# Patient Record
Sex: Female | Born: 1950 | Race: Black or African American | Hispanic: No | Marital: Single
Health system: Midwestern US, Community
[De-identification: ages and names within clinical notes are randomized; demographics above are authoritative.]

## PROBLEM LIST (undated history)

## (undated) DIAGNOSIS — C569 Malignant neoplasm of unspecified ovary: Secondary | ICD-10-CM

## (undated) DIAGNOSIS — F329 Major depressive disorder, single episode, unspecified: Secondary | ICD-10-CM

## (undated) DIAGNOSIS — I10 Essential (primary) hypertension: Secondary | ICD-10-CM

## (undated) DIAGNOSIS — I951 Orthostatic hypotension: Secondary | ICD-10-CM

## (undated) DIAGNOSIS — Z5189 Encounter for other specified aftercare: Secondary | ICD-10-CM

## (undated) DIAGNOSIS — E079 Disorder of thyroid, unspecified: Secondary | ICD-10-CM

## (undated) DIAGNOSIS — D689 Coagulation defect, unspecified: Secondary | ICD-10-CM

## (undated) DIAGNOSIS — D649 Anemia, unspecified: Secondary | ICD-10-CM

## (undated) DIAGNOSIS — I639 Cerebral infarction, unspecified: Secondary | ICD-10-CM

## (undated) DIAGNOSIS — T7840XA Allergy, unspecified, initial encounter: Secondary | ICD-10-CM

## (undated) DIAGNOSIS — F32A Depression, unspecified: Secondary | ICD-10-CM

## (undated) DIAGNOSIS — H269 Unspecified cataract: Secondary | ICD-10-CM

## (undated) DIAGNOSIS — R55 Syncope and collapse: Secondary | ICD-10-CM

## (undated) DIAGNOSIS — F419 Anxiety disorder, unspecified: Secondary | ICD-10-CM

## (undated) DIAGNOSIS — N183 Chronic kidney disease, stage 3 (moderate): Principal | ICD-10-CM

## (undated) DIAGNOSIS — K219 Gastro-esophageal reflux disease without esophagitis: Secondary | ICD-10-CM

## (undated) DIAGNOSIS — E785 Hyperlipidemia, unspecified: Secondary | ICD-10-CM

## (undated) DIAGNOSIS — J439 Emphysema, unspecified: Secondary | ICD-10-CM

## (undated) DIAGNOSIS — M1990 Unspecified osteoarthritis, unspecified site: Secondary | ICD-10-CM

## (undated) DIAGNOSIS — J449 Chronic obstructive pulmonary disease, unspecified: Secondary | ICD-10-CM

## (undated) HISTORY — PX: CARDIAC CATHETERIZATION: SHX172

## (undated) HISTORY — DX: Malignant neoplasm of unspecified ovary: C56.9

## (undated) HISTORY — DX: Unspecified osteoarthritis, unspecified site: M19.90

## (undated) HISTORY — DX: Chronic obstructive pulmonary disease, unspecified: J44.9

## (undated) HISTORY — DX: Essential (primary) hypertension: I10

## (undated) HISTORY — DX: Emphysema, unspecified: J43.9

## (undated) HISTORY — PX: CHOLECYSTECTOMY: SHX55

## (undated) HISTORY — DX: Depression, unspecified: F32.A

## (undated) HISTORY — DX: Disorder of thyroid, unspecified: E07.9

## (undated) HISTORY — PX: CORONARY ANGIOPLASTY WITH STENT PLACEMENT: SHX49

## (undated) HISTORY — DX: Orthostatic hypotension: I95.1

## (undated) HISTORY — DX: Allergy, unspecified, initial encounter: T78.40XA

## (undated) HISTORY — DX: Cerebral infarction, unspecified: I63.9

## (undated) HISTORY — DX: Chronic kidney disease, stage 3 (moderate): N18.3

## (undated) HISTORY — DX: Hyperlipidemia, unspecified: E78.5

## (undated) HISTORY — PX: ABDOMINAL HYSTERECTOMY: SHX81

## (undated) HISTORY — PX: OOPHORECTOMY: SHX86

## (undated) HISTORY — DX: Major depressive disorder, single episode, unspecified: F32.9

## (undated) HISTORY — DX: Syncope and collapse: R55

## (undated) HISTORY — DX: Anxiety disorder, unspecified: F41.9

## (undated) HISTORY — DX: Encounter for other specified aftercare: Z51.89

## (undated) HISTORY — DX: Coagulation defect, unspecified: D68.9

## (undated) HISTORY — DX: Anemia, unspecified: D64.9

## (undated) HISTORY — DX: Gastro-esophageal reflux disease without esophagitis: K21.9

## (undated) HISTORY — DX: Unspecified cataract: H26.9

---

## 2003-05-30 ENCOUNTER — Ambulatory Visit (HOSPITAL_COMMUNITY): Admission: RE | Admit: 2003-05-30 | Discharge: 2003-05-30 | Payer: Self-pay | Admitting: Family Medicine

## 2004-08-12 ENCOUNTER — Ambulatory Visit (HOSPITAL_COMMUNITY): Admission: RE | Admit: 2004-08-12 | Discharge: 2004-08-12 | Payer: Self-pay | Admitting: Family Medicine

## 2004-10-17 ENCOUNTER — Ambulatory Visit (HOSPITAL_COMMUNITY): Admission: RE | Admit: 2004-10-17 | Discharge: 2004-10-17 | Payer: Self-pay | Admitting: Family Medicine

## 2006-04-08 ENCOUNTER — Ambulatory Visit (HOSPITAL_COMMUNITY): Admission: RE | Admit: 2006-04-08 | Discharge: 2006-04-08 | Payer: Self-pay | Admitting: Family Medicine

## 2009-03-06 ENCOUNTER — Emergency Department (HOSPITAL_COMMUNITY): Admission: EM | Admit: 2009-03-06 | Discharge: 2009-03-06 | Payer: Self-pay | Admitting: Emergency Medicine

## 2009-04-22 ENCOUNTER — Emergency Department (HOSPITAL_COMMUNITY): Admission: EM | Admit: 2009-04-22 | Discharge: 2009-04-22 | Payer: Self-pay | Admitting: Emergency Medicine

## 2009-08-21 ENCOUNTER — Ambulatory Visit (HOSPITAL_COMMUNITY): Admission: RE | Admit: 2009-08-21 | Discharge: 2009-08-21 | Payer: Self-pay | Admitting: Family Medicine

## 2009-10-08 ENCOUNTER — Encounter (INDEPENDENT_AMBULATORY_CARE_PROVIDER_SITE_OTHER): Payer: Self-pay | Admitting: Cardiology

## 2009-10-08 ENCOUNTER — Ambulatory Visit (HOSPITAL_COMMUNITY): Admission: RE | Admit: 2009-10-08 | Discharge: 2009-10-08 | Payer: Self-pay | Admitting: Cardiology

## 2009-10-18 ENCOUNTER — Ambulatory Visit (HOSPITAL_COMMUNITY): Admission: RE | Admit: 2009-10-18 | Discharge: 2009-10-18 | Payer: Self-pay | Admitting: Cardiology

## 2009-10-24 ENCOUNTER — Inpatient Hospital Stay (HOSPITAL_COMMUNITY): Admission: RE | Admit: 2009-10-24 | Discharge: 2009-10-25 | Payer: Self-pay | Admitting: Cardiology

## 2009-12-20 ENCOUNTER — Ambulatory Visit (HOSPITAL_COMMUNITY): Admission: RE | Admit: 2009-12-20 | Discharge: 2009-12-20 | Payer: Self-pay | Admitting: Cardiology

## 2011-01-12 ENCOUNTER — Encounter: Payer: Self-pay | Admitting: Family Medicine

## 2011-03-24 LAB — CREATININE, SERUM
Creatinine, Ser: 0.79 mg/dL (ref 0.4–1.2)
GFR calc Af Amer: >60 mL/min (ref >60)
GFR calc non Af Amer: >60 mL/min (ref >60)

## 2011-03-26 LAB — URINALYSIS, ROUTINE W REFLEX MICROSCOPIC
Bilirubin Urine: NEGATIVE
Glucose, UA: NEGATIVE mg/dL
Ketones, ur: 15 mg/dL — AB
Nitrite: NEGATIVE
Protein, ur: NEGATIVE mg/dL
Specific Gravity, Urine: 1.014 (ref 1.005–1.030)
Urobilinogen, UA: 0.2 mg/dL (ref 0.0–1.0)
pH: 6 (ref 5.0–8.0)

## 2011-03-26 LAB — CARDIAC PANEL(CRET KIN+CKTOT+MB+TROPI)
CK, MB: 1.5 ng/mL (ref 0.3–4.0)
Relative Index: INVALID (ref 0.0–2.5)
Total CK: 81 U/L (ref 7–177)
Troponin I: 0.06 ng/mL (ref 0.00–0.06)

## 2011-03-26 LAB — POCT I-STAT 4, (NA,K, GLUC, HGB,HCT)
Glucose, Bld: 121 mg/dL — ABNORMAL HIGH (ref 70–99)
HCT: 43 % (ref 36.0–46.0)
Hemoglobin: 14.6 g/dL (ref 12.0–15.0)
Potassium: 3.6 mEq/L (ref 3.5–5.1)
Sodium: 140 mEq/L (ref 135–145)

## 2011-03-26 LAB — URINE MICROSCOPIC-ADD ON

## 2011-03-26 LAB — POCT I-STAT 3, ART BLOOD GAS (G3+)
Acid-base deficit: 3 mmol/L — ABNORMAL HIGH (ref 0.0–2.0)
Bicarbonate: 21.9 mEq/L (ref 20.0–24.0)
O2 Saturation: 92 %
TCO2: 23 mmol/L (ref 0–100)
pCO2 arterial: 36.5 mmHg (ref 35.0–45.0)
pH, Arterial: 7.386 (ref 7.350–7.400)
pO2, Arterial: 65 mmHg — ABNORMAL LOW (ref 80.0–100.0)

## 2011-03-26 LAB — BASIC METABOLIC PANEL
BUN: 6 mg/dL (ref 6–23)
CO2: 24 mEq/L (ref 19–32)
Calcium: 7.7 mg/dL — ABNORMAL LOW (ref 8.4–10.5)
Chloride: 107 mEq/L (ref 96–112)
Creatinine, Ser: 0.98 mg/dL (ref 0.4–1.2)
GFR calc Af Amer: >60 mL/min (ref >60)
GFR calc non Af Amer: 59 mL/min — ABNORMAL LOW (ref >60)
Glucose, Bld: 102 mg/dL — ABNORMAL HIGH (ref 70–99)
Potassium: 3.6 mEq/L (ref 3.5–5.1)
Sodium: 136 mEq/L (ref 135–145)

## 2011-03-26 LAB — CBC
HCT: 35 % — ABNORMAL LOW (ref 36.0–46.0)
Hemoglobin: 12.2 g/dL (ref 12.0–15.0)
MCHC: 34.9 g/dL (ref 30.0–36.0)
MCV: 96.8 fL (ref 78.0–100.0)
Platelets: 236 10*3/uL (ref 150–400)
RBC: 3.61 MIL/uL — ABNORMAL LOW (ref 3.87–5.11)
RDW: 12.8 % (ref 11.5–15.5)
WBC: 7.4 10*3/uL (ref 4.0–10.5)

## 2011-05-05 ENCOUNTER — Other Ambulatory Visit: Payer: Self-pay | Admitting: Medical

## 2011-07-02 ENCOUNTER — Other Ambulatory Visit: Payer: Self-pay | Admitting: Medical

## 2011-07-09 ENCOUNTER — Other Ambulatory Visit: Payer: Self-pay | Admitting: Medical

## 2011-07-09 NOTE — Telephone Encounter (Signed)
Is this ok?

## 2011-09-01 ENCOUNTER — Other Ambulatory Visit: Payer: Self-pay | Admitting: Medical

## 2012-02-14 ENCOUNTER — Other Ambulatory Visit: Payer: Self-pay | Admitting: Medical

## 2012-03-24 ENCOUNTER — Other Ambulatory Visit: Payer: Self-pay | Admitting: Medical

## 2012-03-24 NOTE — Telephone Encounter (Deleted)
Is this ok?

## 2014-09-13 ENCOUNTER — Other Ambulatory Visit (HOSPITAL_COMMUNITY): Payer: Self-pay | Admitting: Oral Surgery

## 2014-09-13 DIAGNOSIS — G8929 Other chronic pain: Secondary | ICD-10-CM

## 2014-09-13 DIAGNOSIS — M26629 Arthralgia of temporomandibular joint, unspecified side: Principal | ICD-10-CM

## 2014-09-14 ENCOUNTER — Other Ambulatory Visit (HOSPITAL_COMMUNITY): Payer: Self-pay | Admitting: Oral Surgery

## 2014-09-21 ENCOUNTER — Ambulatory Visit (HOSPITAL_COMMUNITY): Payer: Medicare Other

## 2014-09-26 ENCOUNTER — Ambulatory Visit (HOSPITAL_COMMUNITY): Admission: RE | Admit: 2014-09-26 | Payer: Medicare Other | Source: Ambulatory Visit

## 2014-10-04 ENCOUNTER — Ambulatory Visit (HOSPITAL_COMMUNITY): Admission: RE | Admit: 2014-10-04 | Payer: Medicare Other | Source: Ambulatory Visit

## 2014-10-19 ENCOUNTER — Ambulatory Visit (HOSPITAL_COMMUNITY)
Admission: RE | Admit: 2014-10-19 | Discharge: 2014-10-19 | Disposition: A | Discharge status: Home / Self Care | Payer: Medicare Other | Source: Ambulatory Visit | Attending: Oral Surgery | Admitting: Oral Surgery

## 2014-10-19 DIAGNOSIS — M26629 Arthralgia of temporomandibular joint, unspecified side: Principal | ICD-10-CM

## 2014-10-19 DIAGNOSIS — G8929 Other chronic pain: Secondary | ICD-10-CM

## 2014-10-20 ENCOUNTER — Ambulatory Visit (HOSPITAL_COMMUNITY)
Admission: RE | Admit: 2014-10-20 | Discharge: 2014-10-20 | Disposition: A | Discharge status: Home / Self Care | Payer: Medicare Other | Source: Ambulatory Visit | Attending: Oral Surgery | Admitting: Oral Surgery

## 2014-10-20 ENCOUNTER — Other Ambulatory Visit (HOSPITAL_COMMUNITY): Payer: Medicare Other

## 2014-10-20 ENCOUNTER — Encounter (HOSPITAL_COMMUNITY): Payer: Self-pay

## 2014-10-20 ENCOUNTER — Other Ambulatory Visit (HOSPITAL_COMMUNITY): Payer: Self-pay | Admitting: Oral Surgery

## 2014-10-20 DIAGNOSIS — G8929 Other chronic pain: Secondary | ICD-10-CM

## 2014-10-20 DIAGNOSIS — M26629 Arthralgia of temporomandibular joint, unspecified side: Secondary | ICD-10-CM

## 2014-10-20 DIAGNOSIS — R6884 Jaw pain: Secondary | ICD-10-CM | POA: Insufficient documentation

## 2014-12-22 HISTORY — PX: TRIGEMINAL NERVE DECOMPRESSION: SHX2579

## 2015-05-15 ENCOUNTER — Telehealth (HOSPITAL_COMMUNITY): Payer: Self-pay | Admitting: *Deleted

## 2015-06-27 ENCOUNTER — Ambulatory Visit (HOSPITAL_COMMUNITY): Payer: Self-pay | Admitting: Psychiatry

## 2015-12-28 ENCOUNTER — Telehealth (HOSPITAL_COMMUNITY): Payer: Self-pay | Admitting: *Deleted

## 2016-01-14 ENCOUNTER — Ambulatory Visit (HOSPITAL_COMMUNITY): Payer: Self-pay | Admitting: Psychiatry

## 2016-02-14 ENCOUNTER — Ambulatory Visit (HOSPITAL_COMMUNITY): Payer: Self-pay | Admitting: Psychiatry

## 2016-03-26 ENCOUNTER — Encounter (HOSPITAL_COMMUNITY): Payer: Self-pay | Admitting: Psychiatry

## 2016-03-26 ENCOUNTER — Ambulatory Visit (INDEPENDENT_AMBULATORY_CARE_PROVIDER_SITE_OTHER): Payer: Medicare Other | Admitting: Psychiatry

## 2016-03-26 VITALS — BP 153/94 | HR 75 | Ht <= 58 in | Wt 128.8 lb

## 2016-03-26 DIAGNOSIS — F4310 Post-traumatic stress disorder, unspecified: Secondary | ICD-10-CM

## 2016-03-26 MED ORDER — DULOXETINE HCL 60 MG PO CPEP: 60.0000 mg | ORAL_CAPSULE | Freq: Two times a day (BID) | ORAL | Status: DC

## 2016-03-26 MED ORDER — ALPRAZOLAM 0.5 MG PO TABS: 0.5000 mg | ORAL_TABLET | Freq: Every day | ORAL | Status: DC
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
## 2016-04-17 ENCOUNTER — Ambulatory Visit (INDEPENDENT_AMBULATORY_CARE_PROVIDER_SITE_OTHER): Payer: Medicare Other | Admitting: Psychology

## 2016-04-17 DIAGNOSIS — F4310 Post-traumatic stress disorder, unspecified: Secondary | ICD-10-CM
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
## 2016-04-24 ENCOUNTER — Encounter (HOSPITAL_COMMUNITY): Payer: Self-pay | Admitting: Psychiatry

## 2016-04-24 ENCOUNTER — Ambulatory Visit (INDEPENDENT_AMBULATORY_CARE_PROVIDER_SITE_OTHER): Payer: Medicare Other | Admitting: Psychiatry

## 2016-04-24 VITALS — BP 113/73 | HR 80 | Ht <= 58 in | Wt 131.0 lb

## 2016-04-24 DIAGNOSIS — F4310 Post-traumatic stress disorder, unspecified: Secondary | ICD-10-CM | POA: Diagnosis not present

## 2016-04-24 MED ORDER — ALPRAZOLAM 0.5 MG PO TABS: 0.5000 mg | ORAL_TABLET | Freq: Every day | ORAL | Status: DC

## 2016-04-24 MED ORDER — DULOXETINE HCL 60 MG PO CPEP: 60.0000 mg | ORAL_CAPSULE | Freq: Two times a day (BID) | ORAL | Status: DC
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
## 2016-04-29 ENCOUNTER — Encounter: Payer: Self-pay | Admitting: Obstetrics & Gynecology

## 2016-04-29 ENCOUNTER — Ambulatory Visit (INDEPENDENT_AMBULATORY_CARE_PROVIDER_SITE_OTHER): Payer: Medicare Other | Admitting: Obstetrics & Gynecology

## 2016-04-29 VITALS — BP 130/90 | HR 76 | Ht <= 58 in | Wt 134.0 lb

## 2016-04-29 DIAGNOSIS — A499 Bacterial infection, unspecified: Secondary | ICD-10-CM

## 2016-04-29 DIAGNOSIS — B9689 Other specified bacterial agents as the cause of diseases classified elsewhere: Secondary | ICD-10-CM

## 2016-04-29 DIAGNOSIS — N760 Acute vaginitis: Secondary | ICD-10-CM

## 2016-04-29 MED ORDER — METRONIDAZOLE 0.75 % VA GEL: VAGINAL | Status: DC

## 2016-04-29 MED ORDER — DOXYCYCLINE HYCLATE 100 MG PO TABS: 100.0000 mg | ORAL_TABLET | Freq: Two times a day (BID) | ORAL | Status: DC

## 2016-04-29 NOTE — Progress Notes (Signed)
Patient ID: Kelly Chambers, female   DOB: May 26, 1951, 65 y.o.   MRN: [id number]       Chief Complaint  Patient presents with  . nwe gyn visit    vaginal discharge, gray in color/ hurt when has sex    Blood pressure 130/90, pulse 76, height 4\' 8"  (1.422 m), weight 134 lb (60.782 kg).  65 y.o. No obstetric history on file. No LMP recorded. Patient is postmenopausal. The current method of family planning is .  Subjective Vaginal discharge for several  weeks Itching yes Irritation yes Odor no Similar to previous no  Previous treatment   Objective Vulva:  normal appearing vulva with no masses, tenderness or lesions, vulvar erythema  Vagina:  normal mucosa, thin grey discharge Cervix:   Uterus:   Adnexa: ovaries:,       Pertinent ROS No burning with urination, frequency or urgency No nausea, vomiting or diarrhea Nor fever chills or other constitutional symptoms   Labs or studies Wet Prep:   A sample of vaginal discharge was obtained from the posterior fornix using a cotton swab. 2 drops of saline were placed on a slide and the cotton swab was immersed in the saline. Microscopic evaluation was performed and results were as follows:  Negative  for yeast  Positive for clue cells , consistent with Bacterial vaginosis Negative for trichomonas  Abnormal- heavy WBC population   Whiff test: Positive     Impression Diagnoses this Encounter::   ICD-9-CM ICD-10-CM   1. BV (bacterial vaginosis) 616.10 N76.0    041.9 A49.9   2. Vaginitis 616.10 N76.0     Established relevant diagnosis(es):   Plan/Recommendations: Meds ordered this encounter  Medications  . doxycycline (VIBRA-TABS) 100 MG tablet    Sig: Take 1 tablet (100 mg total) by mouth 2 (two) times daily.    Dispense:  20 tablet    Refill:  0  . metroNIDAZOLE (METROGEL VAGINAL) 0.75 % vaginal gel    Sig: Use every other Night x 10 nights    Dispense:  70 g    Refill:  0    Labs or Scans Ordered: No  orders of the defined types were placed in this encounter.    Management::   Follow up Return in about 3 weeks (around 05/20/2016) for Follow up, with Dr Elonda Husky.          All questions were answered.

## 2016-05-09 ENCOUNTER — Ambulatory Visit (INDEPENDENT_AMBULATORY_CARE_PROVIDER_SITE_OTHER): Payer: Medicare Other | Admitting: Psychology

## 2016-05-09 ENCOUNTER — Encounter (HOSPITAL_COMMUNITY): Payer: Self-pay | Admitting: Psychology

## 2016-05-09 DIAGNOSIS — F4310 Post-traumatic stress disorder, unspecified: Secondary | ICD-10-CM
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
## 2016-05-20 ENCOUNTER — Ambulatory Visit: Payer: Medicare Other | Admitting: Obstetrics & Gynecology

## 2016-05-23 ENCOUNTER — Ambulatory Visit (HOSPITAL_COMMUNITY): Payer: Self-pay | Admitting: Psychology

## 2016-06-25 ENCOUNTER — Ambulatory Visit (INDEPENDENT_AMBULATORY_CARE_PROVIDER_SITE_OTHER): Payer: Medicare Other | Admitting: Psychiatry

## 2016-06-25 ENCOUNTER — Encounter (HOSPITAL_COMMUNITY): Payer: Self-pay | Admitting: Psychiatry

## 2016-06-25 VITALS — BP 180/108 | Ht <= 58 in | Wt 129.0 lb

## 2016-06-25 DIAGNOSIS — F4310 Post-traumatic stress disorder, unspecified: Secondary | ICD-10-CM

## 2016-06-25 MED ORDER — DULOXETINE HCL 60 MG PO CPEP: 60.0000 mg | ORAL_CAPSULE | Freq: Two times a day (BID) | ORAL | Status: DC

## 2016-06-25 MED ORDER — ALPRAZOLAM 0.5 MG PO TABS: 0.5000 mg | ORAL_TABLET | Freq: Three times a day (TID) | ORAL | Status: DC | PRN
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
## 2016-07-15 ENCOUNTER — Ambulatory Visit (INDEPENDENT_AMBULATORY_CARE_PROVIDER_SITE_OTHER): Payer: Medicare Other | Admitting: Psychiatry

## 2016-07-15 ENCOUNTER — Encounter (HOSPITAL_COMMUNITY): Payer: Self-pay | Admitting: Psychiatry

## 2016-07-15 VITALS — BP 151/87 | HR 72 | Ht <= 58 in | Wt 134.4 lb

## 2016-07-15 DIAGNOSIS — F4310 Post-traumatic stress disorder, unspecified: Secondary | ICD-10-CM
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
## 2016-07-24 ENCOUNTER — Ambulatory Visit (INDEPENDENT_AMBULATORY_CARE_PROVIDER_SITE_OTHER): Payer: Medicare Other | Admitting: Psychology

## 2016-08-26 ENCOUNTER — Ambulatory Visit (INDEPENDENT_AMBULATORY_CARE_PROVIDER_SITE_OTHER): Payer: Medicare Other | Admitting: Psychiatry

## 2016-08-26 ENCOUNTER — Encounter (HOSPITAL_COMMUNITY): Payer: Self-pay | Admitting: Psychiatry

## 2016-08-26 VITALS — BP 161/97 | HR 82 | Ht <= 58 in | Wt 136.6 lb

## 2016-08-26 DIAGNOSIS — F4310 Post-traumatic stress disorder, unspecified: Secondary | ICD-10-CM | POA: Diagnosis not present

## 2016-08-26 MED ORDER — ALPRAZOLAM 0.5 MG PO TABS: 0.5000 mg | ORAL_TABLET | Freq: Three times a day (TID) | ORAL | 2 refills | Status: DC | PRN

## 2016-08-26 MED ORDER — FLUOXETINE HCL 20 MG PO CAPS: 20.0000 mg | ORAL_CAPSULE | Freq: Every day | ORAL | 2 refills | Status: DC
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
## 2016-08-27 ENCOUNTER — Ambulatory Visit (HOSPITAL_COMMUNITY): Payer: Self-pay | Admitting: Psychology

## 2016-09-23 ENCOUNTER — Ambulatory Visit (HOSPITAL_COMMUNITY): Payer: Self-pay | Admitting: Psychiatry

## 2016-10-02 ENCOUNTER — Encounter (HOSPITAL_COMMUNITY): Payer: Self-pay | Admitting: Psychiatry

## 2016-10-02 ENCOUNTER — Ambulatory Visit (INDEPENDENT_AMBULATORY_CARE_PROVIDER_SITE_OTHER): Payer: Medicare Other | Admitting: Psychiatry

## 2016-10-02 VITALS — BP 143/86 | HR 64 | Ht <= 58 in

## 2016-10-02 DIAGNOSIS — Z811 Family history of alcohol abuse and dependence: Secondary | ICD-10-CM | POA: Diagnosis not present

## 2016-10-02 DIAGNOSIS — F17210 Nicotine dependence, cigarettes, uncomplicated: Secondary | ICD-10-CM | POA: Diagnosis not present

## 2016-10-02 DIAGNOSIS — F1099 Alcohol use, unspecified with unspecified alcohol-induced disorder: Secondary | ICD-10-CM | POA: Diagnosis not present

## 2016-10-02 DIAGNOSIS — F4310 Post-traumatic stress disorder, unspecified: Secondary | ICD-10-CM | POA: Diagnosis not present

## 2016-10-02 MED ORDER — ALPRAZOLAM 0.5 MG PO TABS: 0.5000 mg | ORAL_TABLET | Freq: Three times a day (TID) | ORAL | 2 refills | Status: DC | PRN

## 2016-10-02 MED ORDER — FLUOXETINE HCL 20 MG PO CAPS: 20.0000 mg | ORAL_CAPSULE | Freq: Every day | ORAL | 2 refills | Status: DC
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
## 2017-01-02 ENCOUNTER — Ambulatory Visit (INDEPENDENT_AMBULATORY_CARE_PROVIDER_SITE_OTHER): Payer: Medicare Other | Admitting: Psychiatry

## 2017-01-02 ENCOUNTER — Encounter (HOSPITAL_COMMUNITY): Payer: Self-pay | Admitting: Psychiatry

## 2017-01-02 VITALS — BP 146/92 | HR 86 | Ht <= 58 in | Wt 126.6 lb

## 2017-01-02 DIAGNOSIS — Z818 Family history of other mental and behavioral disorders: Secondary | ICD-10-CM | POA: Diagnosis not present

## 2017-01-02 DIAGNOSIS — Z7982 Long term (current) use of aspirin: Secondary | ICD-10-CM

## 2017-01-02 DIAGNOSIS — Z79899 Other long term (current) drug therapy: Secondary | ICD-10-CM

## 2017-01-02 DIAGNOSIS — Z9889 Other specified postprocedural states: Secondary | ICD-10-CM

## 2017-01-02 DIAGNOSIS — Z811 Family history of alcohol abuse and dependence: Secondary | ICD-10-CM

## 2017-01-02 DIAGNOSIS — F4310 Post-traumatic stress disorder, unspecified: Secondary | ICD-10-CM

## 2017-01-02 DIAGNOSIS — F17210 Nicotine dependence, cigarettes, uncomplicated: Secondary | ICD-10-CM

## 2017-01-02 DIAGNOSIS — Z880 Allergy status to penicillin: Secondary | ICD-10-CM

## 2017-01-02 MED ORDER — ALPRAZOLAM 0.5 MG PO TABS: 0.5000 mg | ORAL_TABLET | Freq: Three times a day (TID) | ORAL | 2 refills | Status: DC | PRN

## 2017-01-02 MED ORDER — FLUOXETINE HCL 20 MG PO CAPS: 20.0000 mg | ORAL_CAPSULE | Freq: Every day | ORAL | 2 refills | Status: DC
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
## 2017-03-26 ENCOUNTER — Ambulatory Visit (INDEPENDENT_AMBULATORY_CARE_PROVIDER_SITE_OTHER): Payer: Medicare Other | Admitting: Psychiatry

## 2017-03-26 ENCOUNTER — Encounter (HOSPITAL_COMMUNITY): Payer: Self-pay | Admitting: Psychiatry

## 2017-03-26 VITALS — BP 165/84 | HR 69 | Ht <= 58 in | Wt 139.4 lb

## 2017-03-26 DIAGNOSIS — Z79899 Other long term (current) drug therapy: Secondary | ICD-10-CM | POA: Diagnosis not present

## 2017-03-26 DIAGNOSIS — Z7982 Long term (current) use of aspirin: Secondary | ICD-10-CM

## 2017-03-26 DIAGNOSIS — Z811 Family history of alcohol abuse and dependence: Secondary | ICD-10-CM | POA: Diagnosis not present

## 2017-03-26 DIAGNOSIS — Z818 Family history of other mental and behavioral disorders: Secondary | ICD-10-CM | POA: Diagnosis not present

## 2017-03-26 DIAGNOSIS — F17210 Nicotine dependence, cigarettes, uncomplicated: Secondary | ICD-10-CM

## 2017-03-26 DIAGNOSIS — F4310 Post-traumatic stress disorder, unspecified: Secondary | ICD-10-CM | POA: Diagnosis not present

## 2017-03-26 MED ORDER — ALPRAZOLAM 0.5 MG PO TABS: 0.5000 mg | ORAL_TABLET | Freq: Three times a day (TID) | ORAL | 2 refills | Status: DC | PRN

## 2017-03-26 MED ORDER — TRAZODONE HCL 50 MG PO TABS: 50.0000 mg | ORAL_TABLET | Freq: Every day | ORAL | 2 refills | Status: DC

## 2017-03-26 MED ORDER — FLUOXETINE HCL 40 MG PO CAPS: 40.0000 mg | ORAL_CAPSULE | Freq: Every day | ORAL | 2 refills | Status: DC
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
## 2017-05-07 ENCOUNTER — Encounter (HOSPITAL_COMMUNITY): Payer: Self-pay | Admitting: Psychiatry

## 2017-05-07 ENCOUNTER — Ambulatory Visit (INDEPENDENT_AMBULATORY_CARE_PROVIDER_SITE_OTHER): Payer: Medicare Other | Admitting: Psychiatry

## 2017-05-07 VITALS — BP 133/85 | HR 77 | Ht <= 58 in | Wt 134.2 lb

## 2017-05-07 DIAGNOSIS — Z811 Family history of alcohol abuse and dependence: Secondary | ICD-10-CM

## 2017-05-07 DIAGNOSIS — Z79899 Other long term (current) drug therapy: Secondary | ICD-10-CM

## 2017-05-07 DIAGNOSIS — F17210 Nicotine dependence, cigarettes, uncomplicated: Secondary | ICD-10-CM | POA: Diagnosis not present

## 2017-05-07 DIAGNOSIS — Z818 Family history of other mental and behavioral disorders: Secondary | ICD-10-CM

## 2017-05-07 DIAGNOSIS — F4310 Post-traumatic stress disorder, unspecified: Secondary | ICD-10-CM | POA: Diagnosis not present

## 2017-05-07 MED ORDER — FLUOXETINE HCL 40 MG PO CAPS: 40.0000 mg | ORAL_CAPSULE | Freq: Every day | ORAL | 2 refills | Status: DC

## 2017-05-07 MED ORDER — ALPRAZOLAM 0.5 MG PO TABS: 0.5000 mg | ORAL_TABLET | Freq: Three times a day (TID) | ORAL | 1 refills | Status: DC | PRN

## 2017-05-07 MED ORDER — TRAZODONE HCL 50 MG PO TABS: 50.0000 mg | ORAL_TABLET | Freq: Every day | ORAL | 2 refills | Status: DC
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
## 2017-06-25 ENCOUNTER — Ambulatory Visit (INDEPENDENT_AMBULATORY_CARE_PROVIDER_SITE_OTHER): Payer: Medicare Other | Admitting: Family Medicine

## 2017-06-25 ENCOUNTER — Encounter: Payer: Self-pay | Admitting: Family Medicine

## 2017-06-25 VITALS — BP 118/70 | HR 72 | Temp 98.7°F | Resp 18 | Ht <= 58 in | Wt 136.0 lb

## 2017-06-25 DIAGNOSIS — Z955 Presence of coronary angioplasty implant and graft: Secondary | ICD-10-CM

## 2017-06-25 DIAGNOSIS — Z720 Tobacco use: Secondary | ICD-10-CM | POA: Insufficient documentation

## 2017-06-25 DIAGNOSIS — I2510 Atherosclerotic heart disease of native coronary artery without angina pectoris: Secondary | ICD-10-CM | POA: Diagnosis not present

## 2017-06-25 DIAGNOSIS — R296 Repeated falls: Secondary | ICD-10-CM | POA: Diagnosis not present

## 2017-06-25 DIAGNOSIS — R42 Dizziness and giddiness: Secondary | ICD-10-CM | POA: Diagnosis not present

## 2017-06-25 DIAGNOSIS — I10 Essential (primary) hypertension: Secondary | ICD-10-CM | POA: Insufficient documentation

## 2017-06-25 DIAGNOSIS — J439 Emphysema, unspecified: Secondary | ICD-10-CM | POA: Diagnosis not present

## 2017-06-25 DIAGNOSIS — E785 Hyperlipidemia, unspecified: Secondary | ICD-10-CM | POA: Diagnosis not present

## 2017-06-25 DIAGNOSIS — J449 Chronic obstructive pulmonary disease, unspecified: Secondary | ICD-10-CM | POA: Insufficient documentation

## 2017-06-25 MED ORDER — OMEPRAZOLE 20 MG PO CPDR: 20.0000 mg | DELAYED_RELEASE_CAPSULE | Freq: Every day | ORAL | 3 refills | Status: DC

## 2017-06-25 NOTE — Progress Notes (Addendum)
Chief Complaint  Patient presents with  . COPD   This is a new patient to establish. She has a history of tobacco abuse. She switched to E cigarettes. She has been reducing the amount of nicotine and the amount of cigarettes. She cannot tell me how many cartridges a day, but tells me she smokes less than half of which she used to. She has COPD. She is out of all of her inhalers. She states she cannot afford to get them filled right now. She is advised to continue to taper and discontinue her cigarettes. She has a history of hypertension. She is on amlodipine 5 mg a day. Her blood pressure is low at 118/70. She brings with her a log of blood pressures at home. They're often under 100 and under 70. She has a history of dizziness and falls. I'm going to cut her amlodipine in half and see if this helps. She has a history of coronary artery disease, coronary stent, and "light strokes". She is on a statin, on an aspirin daily. She denies any current symptoms. She has a history of depression and, anxiety, and PTSD. She is under the care of psychiatry. She takes fluoxetine daily, Xanax 3 times a day, trazodone at night. She states they don't work. She thinks they need to be increased. Paradoxically, she appears to be sedated and somewhat sluggish today. Patient used to have trigeminal neuralgia. She had surgery for a new nerve decompression. She tells me that after her surgery she fell and hit her head. It ever since then she's been dizzy. She thinks she has a head injury. She has not been evaluated, had a CAT scan, seen a neurologist, or been worked up by her prior PCP. She states that she falls frequently. She most recently fell last week.   Patient Active Problem List   Diagnosis Date Noted  . Tobacco abuse 06/25/2017  . COPD with emphysema ([place]) 06/25/2017  . CAD (coronary artery disease) 06/25/2017  . Hx of heart artery stent 06/25/2017  . HLD (hyperlipidemia) 06/25/2017  . Essential hypertension  06/25/2017  . Post traumatic stress disorder 03/26/2016    Outpatient Encounter Prescriptions as of 06/25/2017  Medication Sig  . albuterol (PROVENTIL HFA;VENTOLIN HFA) 108 (90 Base) MCG/ACT inhaler Inhale into the lungs.  . ALPRAZolam (XANAX) 0.5 MG tablet Take 1 tablet (0.5 mg total) by mouth 3 (three) times daily as needed for anxiety.  Marland Kitchen amLODipine (NORVASC) 5 MG tablet Take 2.5 mg by mouth daily.  Marland Kitchen aspirin 81 MG chewable tablet Chew by mouth.  Marland Kitchen atorvastatin (LIPITOR) 10 MG tablet Take by mouth.  . budesonide-formoterol (SYMBICORT) 160-4.5 MCG/ACT inhaler Inhale into the lungs.  . cholecalciferol (VITAMIN D) 1000 units tablet Take 1,000 Units by mouth daily.  Marland Kitchen FLUoxetine (PROZAC) 40 MG capsule Take 1 capsule (40 mg total) by mouth daily.  Marland Kitchen levothyroxine (SYNTHROID, LEVOTHROID) 75 MCG tablet Take by mouth.  . Multiple Vitamin (THERA) TABS Take by mouth.  . naproxen sodium (ANAPROX) 220 MG tablet Take 220 mg by mouth 2 (two) times daily with a meal.  . nitroGLYCERIN (NITROSTAT) 0.4 MG SL tablet Place under the tongue.  Marland Kitchen omeprazole (PRILOSEC) 20 MG capsule Take 1 capsule (20 mg total) by mouth daily.  Marland Kitchen tiotropium (SPIRIVA) 18 MCG inhalation capsule Place into inhaler and inhale.  . traZODone (DESYREL) 50 MG tablet Take 1 tablet (50 mg total) by mouth at bedtime.   No facility-administered encounter medications on file as of 06/25/2017.  Past Medical History:  Diagnosis Date  . Allergy    hay fever  . Anemia   . Anxiety   . Blood transfusion without reported diagnosis    c section  . Cataract    no surgery  . Clotting disorder ([place])   . COPD (chronic obstructive pulmonary disease) ([place])   . Depression   . Emphysema of lung ([place])   . GERD (gastroesophageal reflux disease)   . Hyperlipidemia   . Hypertension   . Osteoarthritis    back hips shoulders and hands  . Stroke ([place])   . Thyroid disease     Past Surgical History:  Procedure Laterality Date  . ABDOMINAL  HYSTERECTOMY     uncertain  . CARDIAC CATHETERIZATION    . CESAREAN SECTION    . CHOLECYSTECTOMY    . CORONARY ANGIOPLASTY WITH STENT PLACEMENT    . TRIGEMINAL NERVE DECOMPRESSION Right 2016    Social History   Social History  . Marital status: Married    Spouse name: Elta Guadeloupe  . Number of children: N/A  . Years of education: 67   Occupational History  . disabled     depression/PTSD   Social History Main Topics  . Smoking status: Current Every Day Smoker    Packs/day: 0.50    Types: E-cigarettes  . Smokeless tobacco: Former Systems developer  . Alcohol use Yes     Comment: 03-26-16 per pt once every 6 months  . Drug use: No     Comment: 03-26-16 per pt no  . Sexual activity: Not Currently   Other Topics Concern  . Not on file   Social History Narrative   Disabled mental illness   Previously was a Quarry manager for 30 years   Lives with Elta Guadeloupe and    sister Horris Latino and    Royston Cowper, age 67    Family History  Problem Relation Age of Onset  . Alcohol abuse Mother   . Arthritis Mother   . Mental retardation Mother   . Early death Mother 61       per patient, grief  . Cancer Mother        lung  . Alcohol abuse Father   . Cancer Father        everywhere  . Arthritis Father   . Depression Sister   . Alcohol abuse Sister   . Multiple sclerosis Sister   . Alzheimer's disease Sister   . Mental retardation Sister        DOWNS  . Cancer Maternal Grandfather        throat    Review of Systems  Constitutional: Negative for chills, fever and weight loss.  HENT: Positive for hearing loss. Negative for congestion.   Eyes: Positive for blurred vision. Negative for double vision.       States has cataracts  Respiratory: Negative for cough and shortness of breath.   Cardiovascular: Negative for chest pain, palpitations and leg swelling.  Gastrointestinal: Positive for heartburn. Negative for nausea and vomiting.       Controlled on medicine  Genitourinary: Negative.  Negative for dysuria.    Musculoskeletal: Negative.  Negative for myalgias and neck pain.  Skin: Negative for rash.  Neurological: Positive for dizziness, tingling and headaches.  Psychiatric/Behavioral: Positive for depression. Negative for substance abuse and suicidal ideas. The patient is nervous/anxious and has insomnia.     BP 118/70 (BP Location: Right Arm, Patient Position: Sitting, Cuff Size: Normal)   Pulse 72   Temp  98.7 F (37.1 C) (Temporal)   Resp 18   Ht 4\' 8"  (1.422 m)   Wt 136 lb (61.7 kg)   SpO2 96%   BMI 30.49 kg/m   Physical Exam  Constitutional: She appears well-developed and well-nourished.  Looks older than stated age. Malodorous. Difficult to understand. Inattentive  HENT:  Head: Normocephalic and atraumatic.  Mouth/Throat: Oropharynx is clear and moist.  Edentulous  Eyes: Conjunctivae are normal. Pupils are equal, round, and reactive to light.  Neck: Normal range of motion. No thyromegaly present.  Cardiovascular: Normal rate, regular rhythm and normal heart sounds.   Pulmonary/Chest: Effort normal and breath sounds normal. She has no wheezes.  Abdominal: Soft. Bowel sounds are normal. She exhibits no distension.  Musculoskeletal: Normal range of motion. She exhibits no edema.  Lymphadenopathy:    She has no cervical adenopathy.  Neurological: She is alert. Coordination normal.  Psychiatric: Her affect is blunt. Her speech is slurred. She is withdrawn.  Mild slurred speech. Appears sedated. Asks me to repeat many questions. Poor comprehension. Poor insight. She is inattentive.     1. Tobacco abuse Patient states she is actively trying to stop smoking  2. Pulmonary emphysema, unspecified emphysema type ([place]) Patient is out of her inhalers. She has no wheezing today.  3. Coronary artery disease involving native coronary artery of native heart without angina pectoris  - Vitamin B12 - COMPLETE METABOLIC PANEL WITH GFR - Hepatitis C antibody - HIV antibody - Lipid  panel - Urinalysis, Routine w reflex microscopic - VITAMIN D 25 Hydroxy (Vit-D Deficiency, Fractures)  4. Hx of heart artery stent 2010. No cardiology follow-up  5. Hyperlipidemia, unspecified hyperlipidemia type Compliant with atorvastatin  6. Essential hypertension On amlodipine 5 a day. Low blood pressure and dizziness. We'll reduce  7. Falls frequently - Ambulatory referral to Neurology  8. Dizziness, nonspecific - Ambulatory referral to Neurology Greater than 50% of this visit was spent in counseling and coordinating care.  Total face to face time:  45 min. most of this was obtaining history and reviewing records, also discussing her dizziness, falls, headaches, possible head injury   Patient Instructions  Stop the meclizine Cut the amlodipine in half  Need records Dr Wenda Overland  I have referred to neurology specialist for evaluation of dizziness and falls  Lab testing ordered  See me in one month     Kelly Everts, MD

## 2017-06-25 NOTE — Patient Instructions (Signed)
Stop the meclizine Cut the amlodipine in half  Need records Dr Wenda Overland  I have referred to neurology specialist for evaluation of dizziness and falls  Lab testing ordered  See me in one month

## 2017-06-26 ENCOUNTER — Encounter: Payer: Self-pay | Admitting: Family Medicine

## 2017-06-26 DIAGNOSIS — N183 Chronic kidney disease, stage 3 unspecified: Secondary | ICD-10-CM

## 2017-06-26 HISTORY — DX: Chronic kidney disease, stage 3 unspecified: N18.30

## 2017-06-26 LAB — COMPLETE METABOLIC PANEL WITH GFR
ALT: 9 U/L (ref 6–29)
AST: 19 U/L (ref 10–35)
Albumin: 4.1 g/dL (ref 3.6–5.1)
Alkaline Phosphatase: 68 U/L (ref 33–130)
BILIRUBIN TOTAL: 0.7 mg/dL (ref 0.2–1.2)
BUN: 15 mg/dL (ref 7–25)
CO2: 24 mmol/L (ref 20–31)
CREATININE: 1.2 mg/dL — AB (ref 0.50–0.99)
Calcium: 8.7 mg/dL (ref 8.6–10.4)
Chloride: 105 mmol/L (ref 98–110)
GFR, Est African American: 55 mL/min — ABNORMAL LOW (ref >=60)
GFR, Est Non African American: 48 mL/min — ABNORMAL LOW (ref >=60)
GLUCOSE: 92 mg/dL (ref 65–99)
Potassium: 4.1 mmol/L (ref 3.5–5.3)
SODIUM: 140 mmol/L (ref 135–146)
TOTAL PROTEIN: 6.6 g/dL (ref 6.1–8.1)

## 2017-06-26 LAB — URINALYSIS, ROUTINE W REFLEX MICROSCOPIC
BILIRUBIN URINE: NEGATIVE
GLUCOSE, UA: NEGATIVE
HGB URINE DIPSTICK: NEGATIVE
KETONES UR: NEGATIVE
LEUKOCYTES UA: NEGATIVE
Nitrite: NEGATIVE
PH: 5.5 (ref 5.0–8.0)
PROTEIN: NEGATIVE
Specific Gravity, Urine: 1.017 (ref 1.001–1.035)

## 2017-06-26 LAB — HEPATITIS C ANTIBODY: HCV Ab: NEGATIVE

## 2017-06-26 LAB — LIPID PANEL
CHOL/HDL RATIO: 2.9 ratio (ref <5.0)
CHOLESTEROL: 140 mg/dL (ref <200)
HDL: 49 mg/dL — AB (ref >50)
LDL CALC: 75 mg/dL (ref <100)
TRIGLYCERIDES: 80 mg/dL (ref <150)
VLDL: 16 mg/dL (ref <30)

## 2017-06-26 LAB — HIV ANTIBODY (ROUTINE TESTING W REFLEX): HIV 1&2 Ab, 4th Generation: NONREACTIVE

## 2017-06-26 LAB — VITAMIN D 25 HYDROXY (VIT D DEFICIENCY, FRACTURES): Vit D, 25-Hydroxy: 40 ng/mL (ref 30–100)

## 2017-06-26 LAB — VITAMIN B12: VITAMIN B 12: 768 pg/mL (ref 200–1100)

## 2017-07-15 DIAGNOSIS — S92524A Nondisplaced fracture of medial phalanx of right lesser toe(s), initial encounter for closed fracture: Secondary | ICD-10-CM | POA: Diagnosis not present

## 2017-07-15 DIAGNOSIS — I10 Essential (primary) hypertension: Secondary | ICD-10-CM | POA: Diagnosis not present

## 2017-07-15 DIAGNOSIS — S62642A Nondisplaced fracture of proximal phalanx of right middle finger, initial encounter for closed fracture: Secondary | ICD-10-CM | POA: Diagnosis not present

## 2017-07-15 DIAGNOSIS — J449 Chronic obstructive pulmonary disease, unspecified: Secondary | ICD-10-CM | POA: Diagnosis not present

## 2017-07-15 DIAGNOSIS — Z7982 Long term (current) use of aspirin: Secondary | ICD-10-CM | POA: Diagnosis not present

## 2017-07-15 DIAGNOSIS — E039 Hypothyroidism, unspecified: Secondary | ICD-10-CM | POA: Diagnosis not present

## 2017-07-15 DIAGNOSIS — Z8673 Personal history of transient ischemic attack (TIA), and cerebral infarction without residual deficits: Secondary | ICD-10-CM | POA: Diagnosis not present

## 2017-07-15 DIAGNOSIS — I2510 Atherosclerotic heart disease of native coronary artery without angina pectoris: Secondary | ICD-10-CM | POA: Diagnosis not present

## 2017-07-15 DIAGNOSIS — S92514A Nondisplaced fracture of proximal phalanx of right lesser toe(s), initial encounter for closed fracture: Secondary | ICD-10-CM | POA: Diagnosis not present

## 2017-07-15 DIAGNOSIS — W1839XA Other fall on same level, initial encounter: Secondary | ICD-10-CM | POA: Diagnosis not present

## 2017-07-15 DIAGNOSIS — Z79899 Other long term (current) drug therapy: Secondary | ICD-10-CM | POA: Diagnosis not present

## 2017-07-15 DIAGNOSIS — K219 Gastro-esophageal reflux disease without esophagitis: Secondary | ICD-10-CM | POA: Diagnosis not present

## 2017-07-27 ENCOUNTER — Ambulatory Visit: Payer: Self-pay | Admitting: Family Medicine

## 2017-07-29 ENCOUNTER — Encounter (HOSPITAL_COMMUNITY): Payer: Self-pay | Admitting: Psychiatry

## 2017-07-29 ENCOUNTER — Ambulatory Visit (INDEPENDENT_AMBULATORY_CARE_PROVIDER_SITE_OTHER): Payer: Medicare Other | Admitting: Psychiatry

## 2017-07-29 VITALS — BP 114/68 | HR 67 | Ht <= 58 in | Wt 134.0 lb

## 2017-07-29 DIAGNOSIS — F17210 Nicotine dependence, cigarettes, uncomplicated: Secondary | ICD-10-CM

## 2017-07-29 DIAGNOSIS — F4310 Post-traumatic stress disorder, unspecified: Secondary | ICD-10-CM | POA: Diagnosis not present

## 2017-07-29 DIAGNOSIS — Z811 Family history of alcohol abuse and dependence: Secondary | ICD-10-CM

## 2017-07-29 DIAGNOSIS — F419 Anxiety disorder, unspecified: Secondary | ICD-10-CM | POA: Diagnosis not present

## 2017-07-29 DIAGNOSIS — Z810 Family history of intellectual disabilities: Secondary | ICD-10-CM | POA: Diagnosis not present

## 2017-07-29 DIAGNOSIS — Z818 Family history of other mental and behavioral disorders: Secondary | ICD-10-CM

## 2017-07-29 DIAGNOSIS — G4700 Insomnia, unspecified: Secondary | ICD-10-CM

## 2017-07-29 MED ORDER — TRAZODONE HCL 50 MG PO TABS: 50.0000 mg | ORAL_TABLET | Freq: Every day | ORAL | 2 refills | Status: DC

## 2017-07-29 MED ORDER — ALPRAZOLAM 0.5 MG PO TABS: 0.5000 mg | ORAL_TABLET | Freq: Three times a day (TID) | ORAL | 1 refills | Status: DC | PRN

## 2017-07-29 MED ORDER — FLUOXETINE HCL 40 MG PO CAPS: 40.0000 mg | ORAL_CAPSULE | Freq: Every day | ORAL | 2 refills | Status: DC
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
## 2017-07-30 ENCOUNTER — Encounter: Payer: Self-pay | Admitting: Family Medicine

## 2017-07-30 ENCOUNTER — Ambulatory Visit (INDEPENDENT_AMBULATORY_CARE_PROVIDER_SITE_OTHER): Payer: Self-pay | Admitting: Family Medicine

## 2017-07-30 VITALS — BP 126/70 | HR 66 | Temp 98.3°F | Resp 18 | Ht <= 58 in | Wt 135.1 lb

## 2017-07-30 DIAGNOSIS — I10 Essential (primary) hypertension: Secondary | ICD-10-CM

## 2017-07-30 DIAGNOSIS — Z720 Tobacco use: Secondary | ICD-10-CM

## 2017-07-30 DIAGNOSIS — I2510 Atherosclerotic heart disease of native coronary artery without angina pectoris: Secondary | ICD-10-CM

## 2017-07-30 DIAGNOSIS — J439 Emphysema, unspecified: Secondary | ICD-10-CM

## 2017-07-30 NOTE — Patient Instructions (Signed)
No change in medicine  Try to quit smoking  Walk every day that you are able  Use the pain cream as needed for back  See me in six months Need blood work next time
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
## 2017-08-20 DIAGNOSIS — Z7982 Long term (current) use of aspirin: Secondary | ICD-10-CM | POA: Diagnosis not present

## 2017-08-20 DIAGNOSIS — M1990 Unspecified osteoarthritis, unspecified site: Secondary | ICD-10-CM | POA: Diagnosis not present

## 2017-08-20 DIAGNOSIS — E039 Hypothyroidism, unspecified: Secondary | ICD-10-CM | POA: Diagnosis not present

## 2017-08-20 DIAGNOSIS — M545 Low back pain: Secondary | ICD-10-CM | POA: Diagnosis not present

## 2017-08-20 DIAGNOSIS — J449 Chronic obstructive pulmonary disease, unspecified: Secondary | ICD-10-CM | POA: Diagnosis not present

## 2017-08-20 DIAGNOSIS — K219 Gastro-esophageal reflux disease without esophagitis: Secondary | ICD-10-CM | POA: Diagnosis not present

## 2017-08-20 DIAGNOSIS — Z955 Presence of coronary angioplasty implant and graft: Secondary | ICD-10-CM | POA: Diagnosis not present

## 2017-08-20 DIAGNOSIS — I10 Essential (primary) hypertension: Secondary | ICD-10-CM | POA: Diagnosis not present

## 2017-08-20 DIAGNOSIS — I2510 Atherosclerotic heart disease of native coronary artery without angina pectoris: Secondary | ICD-10-CM | POA: Diagnosis not present

## 2017-08-20 DIAGNOSIS — W010XXA Fall on same level from slipping, tripping and stumbling without subsequent striking against object, initial encounter: Secondary | ICD-10-CM | POA: Diagnosis not present

## 2017-08-20 DIAGNOSIS — Z79899 Other long term (current) drug therapy: Secondary | ICD-10-CM | POA: Diagnosis not present

## 2017-09-01 DIAGNOSIS — J449 Chronic obstructive pulmonary disease, unspecified: Secondary | ICD-10-CM | POA: Diagnosis not present

## 2017-09-01 DIAGNOSIS — E039 Hypothyroidism, unspecified: Secondary | ICD-10-CM | POA: Diagnosis not present

## 2017-09-01 DIAGNOSIS — R0602 Shortness of breath: Secondary | ICD-10-CM | POA: Diagnosis not present

## 2017-09-01 DIAGNOSIS — R5383 Other fatigue: Secondary | ICD-10-CM | POA: Diagnosis not present

## 2017-09-01 DIAGNOSIS — Z955 Presence of coronary angioplasty implant and graft: Secondary | ICD-10-CM | POA: Diagnosis not present

## 2017-09-01 DIAGNOSIS — R0603 Acute respiratory distress: Secondary | ICD-10-CM | POA: Diagnosis not present

## 2017-09-01 DIAGNOSIS — E7800 Pure hypercholesterolemia, unspecified: Secondary | ICD-10-CM | POA: Diagnosis not present

## 2017-09-01 DIAGNOSIS — I10 Essential (primary) hypertension: Secondary | ICD-10-CM | POA: Diagnosis not present

## 2017-09-01 DIAGNOSIS — K219 Gastro-esophageal reflux disease without esophagitis: Secondary | ICD-10-CM | POA: Diagnosis not present

## 2017-09-01 DIAGNOSIS — R109 Unspecified abdominal pain: Secondary | ICD-10-CM | POA: Diagnosis not present

## 2017-09-01 DIAGNOSIS — R51 Headache: Secondary | ICD-10-CM | POA: Diagnosis not present

## 2017-09-01 DIAGNOSIS — I2510 Atherosclerotic heart disease of native coronary artery without angina pectoris: Secondary | ICD-10-CM | POA: Diagnosis not present

## 2017-09-01 DIAGNOSIS — Z8673 Personal history of transient ischemic attack (TIA), and cerebral infarction without residual deficits: Secondary | ICD-10-CM | POA: Diagnosis not present

## 2017-09-01 DIAGNOSIS — R079 Chest pain, unspecified: Secondary | ICD-10-CM | POA: Diagnosis not present

## 2017-09-01 DIAGNOSIS — M545 Low back pain: Secondary | ICD-10-CM | POA: Diagnosis not present

## 2017-09-01 DIAGNOSIS — G43909 Migraine, unspecified, not intractable, without status migrainosus: Secondary | ICD-10-CM | POA: Diagnosis not present

## 2017-09-01 DIAGNOSIS — R410 Disorientation, unspecified: Secondary | ICD-10-CM | POA: Diagnosis not present

## 2017-09-01 DIAGNOSIS — Z79899 Other long term (current) drug therapy: Secondary | ICD-10-CM | POA: Diagnosis not present

## 2017-09-08 ENCOUNTER — Encounter: Payer: Self-pay | Admitting: Cardiovascular Disease

## 2017-09-09 ENCOUNTER — Other Ambulatory Visit: Payer: Self-pay

## 2017-09-09 MED ORDER — TIOTROPIUM BROMIDE MONOHYDRATE 18 MCG IN CAPS: 18.0000 ug | ORAL_CAPSULE | RESPIRATORY_TRACT | 0 refills | Status: DC | PRN

## 2017-09-10 ENCOUNTER — Telehealth: Payer: Self-pay | Admitting: Family Medicine

## 2017-09-10 NOTE — Telephone Encounter (Signed)
Patient called and left message on voice mail stating that she needed appt because she thinks she has cancer in her mouth and her tongue.  I returned call and left message on voice mail for patient to return call.

## 2017-09-11 ENCOUNTER — Encounter: Payer: Self-pay | Admitting: Family Medicine

## 2017-09-11 ENCOUNTER — Other Ambulatory Visit: Payer: Self-pay

## 2017-09-11 ENCOUNTER — Ambulatory Visit (INDEPENDENT_AMBULATORY_CARE_PROVIDER_SITE_OTHER): Payer: Self-pay | Admitting: Family Medicine

## 2017-09-11 VITALS — BP 132/72 | HR 68 | Temp 98.2°F | Resp 18 | Ht <= 58 in | Wt 125.1 lb

## 2017-09-11 DIAGNOSIS — Z720 Tobacco use: Secondary | ICD-10-CM

## 2017-09-11 DIAGNOSIS — K1379 Other lesions of oral mucosa: Secondary | ICD-10-CM

## 2017-09-11 DIAGNOSIS — H9312 Tinnitus, left ear: Secondary | ICD-10-CM

## 2017-09-11 DIAGNOSIS — R634 Abnormal weight loss: Secondary | ICD-10-CM

## 2017-09-11 DIAGNOSIS — J439 Emphysema, unspecified: Secondary | ICD-10-CM

## 2017-09-11 MED ORDER — MAGIC MOUTHWASH: 5.0000 mL | Freq: Three times a day (TID) | ORAL | 0 refills | Status: DC

## 2017-09-11 MED ORDER — BUDESONIDE-FORMOTEROL FUMARATE 160-4.5 MCG/ACT IN AERO: 1.0000 | INHALATION_SPRAY | Freq: Two times a day (BID) | RESPIRATORY_TRACT | 11 refills | Status: DC

## 2017-09-11 MED ORDER — TIOTROPIUM BROMIDE MONOHYDRATE 18 MCG IN CAPS: 18.0000 ug | ORAL_CAPSULE | RESPIRATORY_TRACT | 3 refills | Status: DC | PRN

## 2017-09-11 NOTE — Patient Instructions (Signed)
Stop smoking ENT referral placed for the ringing in your ear and the sore tongue Mouthwash ordered for the sore mouth Refill Symbicort on the computer (coupon)  See me usual schedule

## 2017-09-11 NOTE — Progress Notes (Signed)
Chief Complaint  Patient presents with  . Oral Pain   Acute visit Tongue pain for a month Worried about tongue cancer Has reduced her smoking to 4 cig a day Trying to quit Also complains of unremitting ringing in the left ear Is out of her inhalers due to cost  Patient Active Problem List   Diagnosis Date Noted  . Renal failure, chronic, stage 3 (moderate) 06/26/2017  . Tobacco abuse 06/25/2017  . COPD with emphysema ([place]) 06/25/2017  . CAD (coronary artery disease) 06/25/2017  . Hx of heart artery stent 06/25/2017  . HLD (hyperlipidemia) 06/25/2017  . Essential hypertension 06/25/2017  . Post traumatic stress disorder 03/26/2016    Outpatient Encounter Prescriptions as of 09/11/2017  Medication Sig  . albuterol (PROVENTIL HFA;VENTOLIN HFA) 108 (90 Base) MCG/ACT inhaler Inhale into the lungs.  . ALPRAZolam (XANAX) 0.5 MG tablet Take 1 tablet (0.5 mg total) by mouth 3 (three) times daily as needed for anxiety.  Marland Kitchen amLODipine (NORVASC) 5 MG tablet Take 2.5 mg by mouth daily.  Marland Kitchen aspirin 81 MG chewable tablet Chew by mouth.  Marland Kitchen atorvastatin (LIPITOR) 10 MG tablet Take by mouth.  . budesonide-formoterol (SYMBICORT) 160-4.5 MCG/ACT inhaler Inhale 1 puff into the lungs 2 (two) times daily.  . cholecalciferol (VITAMIN D) 1000 units tablet Take 1,000 Units by mouth daily.  Marland Kitchen FLUoxetine (PROZAC) 40 MG capsule Take 1 capsule (40 mg total) by mouth daily.  Marland Kitchen levothyroxine (SYNTHROID, LEVOTHROID) 75 MCG tablet Take by mouth.  . Multiple Vitamin (THERA) TABS Take by mouth.  . naproxen sodium (ANAPROX) 220 MG tablet Take 220 mg by mouth 2 (two) times daily with a meal.  . nitroGLYCERIN (NITROSTAT) 0.4 MG SL tablet Place under the tongue.  Marland Kitchen omeprazole (PRILOSEC) 20 MG capsule Take 1 capsule (20 mg total) by mouth daily.  . traZODone (DESYREL) 50 MG tablet Take 1 tablet (50 mg total) by mouth at bedtime.  . magic mouthwash SOLN Take 5 mLs by mouth 3 (three) times daily.  Marland Kitchen tiotropium  (SPIRIVA) 18 MCG inhalation capsule Place 1 capsule (18 mcg total) into inhaler and inhale as needed.   No facility-administered encounter medications on file as of 09/11/2017.     Allergies  Allergen Reactions  . Penicillins Itching    Skin itching    Review of Systems  Constitutional: Positive for fatigue and unexpected weight change.       Weight loss 10 lbs  HENT: Positive for dental problem, mouth sores and tinnitus. Negative for ear pain, postnasal drip and rhinorrhea.   Eyes: Negative for photophobia and visual disturbance.  Respiratory: Positive for shortness of breath. Negative for cough.   Cardiovascular: Negative for chest pain and palpitations.  Gastrointestinal: Negative for constipation and diarrhea.  Neurological: Negative for dizziness and headaches.  Psychiatric/Behavioral: Positive for dysphoric mood. The patient is nervous/anxious.     BP 132/72 (BP Location: Right Arm, Patient Position: Sitting, Cuff Size: Normal)   Pulse 68   Temp 98.2 F (36.8 C)   Resp 18   Ht 4\' 8"  (1.422 m)   Wt 125 lb 1.3 oz (56.7 kg)   SpO2 95%   BMI 28.04 kg/m   Physical Exam  Constitutional: She appears well-developed and well-nourished.  Looks worried, tired  HENT:  Head: Normocephalic and atraumatic.  Right Ear: External ear normal.  Left Ear: External ear normal.  Nose: Nose normal.  Mouth/Throat: Oropharynx is clear and moist.  Tongue on the right lateral side a has  a few angiomata- no lesion identified.  No ulcerations of gums/buccal mucosa.  Left TM dull  Eyes: Pupils are equal, round, and reactive to light. EOM are normal.  Neck: Normal range of motion. No thyromegaly present.  Cardiovascular: Normal rate, regular rhythm and normal heart sounds.   Pulmonary/Chest: Effort normal and breath sounds normal. She has no wheezes.  Lymphadenopathy:    She has no cervical adenopathy.  Psychiatric:  Depressed mood and affect    ASSESSMENT/PLAN:  1. Weight loss Patient  says appetite poor  2. Tobacco abuse Discussed quitting.  "can't afford patches or medicine right now"  Husband won't quit- smokes in the home  3. Sore in mouth rx magic mouthwash - Ambulatory referral to ENT  4. Tinnitus of left ear - Ambulatory referral to ENT  5. Pulmonary emphysema, unspecified emphysema type ([place]) No wheeze off of symbicort.  Advised to go on computer for discounted med   Patient Instructions  Stop smoking ENT referral placed for the ringing in your ear and the sore tongue Mouthwash ordered for the sore mouth Refill Symbicort on the computer (coupon)  See me usual schedule   Raylene Everts, MD

## 2017-09-15 ENCOUNTER — Ambulatory Visit (INDEPENDENT_AMBULATORY_CARE_PROVIDER_SITE_OTHER): Payer: Medicare Other | Admitting: Diagnostic Neuroimaging

## 2017-09-15 ENCOUNTER — Encounter: Payer: Self-pay | Admitting: Diagnostic Neuroimaging

## 2017-09-15 VITALS — BP 116/73 | HR 67 | Ht <= 58 in | Wt 125.0 lb

## 2017-09-15 DIAGNOSIS — G43109 Migraine with aura, not intractable, without status migrainosus: Secondary | ICD-10-CM | POA: Diagnosis not present

## 2017-09-15 DIAGNOSIS — G441 Vascular headache, not elsewhere classified: Secondary | ICD-10-CM | POA: Diagnosis not present

## 2017-09-15 DIAGNOSIS — R630 Anorexia: Secondary | ICD-10-CM

## 2017-09-15 DIAGNOSIS — G4700 Insomnia, unspecified: Secondary | ICD-10-CM | POA: Diagnosis not present

## 2017-09-15 NOTE — Progress Notes (Signed)
[facility]  PATIENT: Kelly Chambers DOB: June 12, 1951  REFERRING CLINICIAN: Irving Burton HISTORY FROM: patient and daughter and chart review REASON FOR VISIT: new consult    HISTORICAL  CHIEF COMPLAINT:  Chief Complaint  Patient presents with  . NP  Meda Coffee  . Dizziness/ Falls    Pt states has some lightheadedness, Had fall and hit r side head and since has headaches.  Falls.  Joint pain.   . headache after fall one month ago.    HISTORY OF PRESENT ILLNESS:    66 year old female here for evaluation of right-sided headaches.  Patient reports intermittent balance and gait difficulty over the past 1-2 years. She has hit her head several times. She did not seek medical attention for these falls. Patient is having almost 3 headaches per week, with right-sided bumping and throbbing sensation, phonophobia, nausea. She takes a few over-the-counter aspirin without relief. Patient had history of migraine headaches at age 68 years old but does not remember details about these headaches.  Patient under significant stress at home. She has poor sleep. She eats once per day and has poor appetite. She has depression and anxiety. She is seeing psychiatry and is on Prozac and Xanax. She recently started trazodone to help with sleep.  Patient also has history of right-sided trigeminal neuralgia with symptoms starting in 2013. She was previously evaluated by another neurologist Dr. Merlene Laughter, who referred patient to [facility] neurosurgery Dr. Salomon Fick, who performed microvascular decompression on the right side in 2016.  Patient also has other symptoms including diffuse joint aches and pains, lightheadedness and dizziness, memory loss, fatigue, blurred vision, ringing in ears.    REVIEW OF SYSTEMS: Full 14 system review of systems performed and negative with exception of: As per history of present illness.   ALLERGIES: Allergies  Allergen Reactions  . Penicillins Itching    Skin  itching    HOME MEDICATIONS: Outpatient Medications Prior to Visit  Medication Sig Dispense Refill  . albuterol (PROVENTIL HFA;VENTOLIN HFA) 108 (90 Base) MCG/ACT inhaler Inhale into the lungs.    . ALPRAZolam (XANAX) 0.5 MG tablet Take 1 tablet (0.5 mg total) by mouth 3 (three) times daily as needed for anxiety. 270 tablet 1  . amLODipine (NORVASC) 5 MG tablet Take 2.5 mg by mouth daily.    Marland Kitchen aspirin 81 MG chewable tablet Chew by mouth.    Marland Kitchen atorvastatin (LIPITOR) 10 MG tablet Take by mouth.    . budesonide-formoterol (SYMBICORT) 160-4.5 MCG/ACT inhaler Inhale 1 puff into the lungs 2 (two) times daily. 1 Inhaler 11  . cholecalciferol (VITAMIN D) 1000 units tablet Take 1,000 Units by mouth daily.    Marland Kitchen FLUoxetine (PROZAC) 40 MG capsule Take 1 capsule (40 mg total) by mouth daily. 90 capsule 2  . levothyroxine (SYNTHROID, LEVOTHROID) 75 MCG tablet Take by mouth.    . magic mouthwash SOLN Take 5 mLs by mouth 3 (three) times daily. 150 mL 0  . Multiple Vitamin (THERA) TABS Take by mouth.    . naproxen sodium (ANAPROX) 220 MG tablet Take 220 mg by mouth 2 (two) times daily with a meal.    . nitroGLYCERIN (NITROSTAT) 0.4 MG SL tablet Place under the tongue.    Marland Kitchen omeprazole (PRILOSEC) 20 MG capsule Take 1 capsule (20 mg total) by mouth daily. 90 capsule 3  . tiotropium (SPIRIVA) 18 MCG inhalation capsule Place 1 capsule (18 mcg total) into inhaler and inhale as needed. 90 capsule 3  . traZODone (DESYREL) 50  MG tablet Take 1 tablet (50 mg total) by mouth at bedtime. 90 tablet 2   No facility-administered medications prior to visit.     PAST MEDICAL HISTORY: Past Medical History:  Diagnosis Date  . Allergy    hay fever  . Anemia   . Anxiety   . Blood transfusion without reported diagnosis    c section  . Cataract    no surgery  . Clotting disorder ([place])   . COPD (chronic obstructive pulmonary disease) ([place])   . Depression   . Emphysema of lung ([place])   . GERD (gastroesophageal reflux  disease)   . Hyperlipidemia   . Hypertension   . Osteoarthritis    back hips shoulders and hands  . Renal failure, chronic, stage 3 (moderate) 06/26/2017  . Stroke ([place])   . Thyroid disease     PAST SURGICAL HISTORY: Past Surgical History:  Procedure Laterality Date  . ABDOMINAL HYSTERECTOMY     uncertain  . CARDIAC CATHETERIZATION    . CESAREAN SECTION    . CHOLECYSTECTOMY    . CORONARY ANGIOPLASTY WITH STENT PLACEMENT    . TRIGEMINAL NERVE DECOMPRESSION Right 2016    FAMILY HISTORY: Family History  Problem Relation Age of Onset  . Alcohol abuse Mother   . Arthritis Mother   . Mental retardation Mother   . Early death Mother 34       per patient, grief  . Cancer Mother        lung  . Alcohol abuse Father   . Cancer Father        everywhere  . Arthritis Father   . Depression Sister   . Alcohol abuse Sister   . Multiple sclerosis Sister   . Alzheimer's disease Sister   . Mental retardation Sister        DOWNS  . Cancer Maternal Grandfather        throat    SOCIAL HISTORY:  Social History   Social History  . Marital status: Married    Spouse name: Elta Guadeloupe  . Number of children: N/A  . Years of education: 95   Occupational History  . disabled     depression/PTSD   Social History Main Topics  . Smoking status: Current Every Day Smoker    Packs/day: 0.50    Types: E-cigarettes  . Smokeless tobacco: Former Systems developer  . Alcohol use Yes     Comment: 03-26-16 per pt once every 6 months  . Drug use: No     Comment: 03-26-16 per pt no  . Sexual activity: Not Currently   Other Topics Concern  . Not on file   Social History Narrative   Disabled mental illness   Previously was a Quarry manager for 30 years   Lives with Elta Guadeloupe and    sister Horris Latino and    Royston Cowper, age 35   Lives at home with husband and sister.  Children 3.  Education   12th grade, trade school.     PHYSICAL EXAM  GENERAL EXAM/CONSTITUTIONAL: Vitals:  Vitals:   09/15/17 1237  BP: 116/73  Pulse: 67   Weight: 125 lb (56.7 kg)  Height: 4\' 8"  (1.422 m)     Body mass index is 28.02 kg/m.  Visual Acuity Screening   Right eye Left eye Both eyes  Without correction:     With correction: 20/40 20/70      Patient is in no distress; well developed, nourished and groomed; neck is supple  SMELLS OF CIG  SMOKE  EDENTULOUS  CARDIOVASCULAR:  Examination of carotid arteries is normal; no carotid bruits  Regular rate and rhythm, no murmurs  Examination of peripheral vascular system by observation and palpation is normal  EYES:  Ophthalmoscopic exam of optic discs and posterior segments is normal; no papilledema or hemorrhages  MUSCULOSKELETAL:  Gait, strength, tone, movements noted in Neurologic exam below  NEUROLOGIC: MENTAL STATUS:  No flowsheet data found.  awake, alert, oriented to person, place and time  recent and remote memory intact  normal attention and concentration  language fluent, comprehension intact, naming intact,   fund of knowledge appropriate  CRANIAL NERVE:   2nd - no papilledema on fundoscopic exam  2nd, 3rd, 4th, 6th - pupils equal and reactive to light, visual fields full to confrontation, extraocular muscles intact, no nystagmus  5th - facial sensation symmetric  7th - facial strength symmetric  8th - hearing intact  9th - palate elevates symmetrically, uvula midline  11th - shoulder shrug symmetric  12th - tongue protrusion midline  MOTOR:   normal bulk and tone, full strength in the BUE, BLE  SENSORY:   normal and symmetric to light touch, temperature, vibration  COORDINATION:   finger-nose-finger, fine finger movements normal  REFLEXES:   deep tendon reflexes TRACE and symmetric  GAIT/STATION:   narrow based gait    DIAGNOSTIC DATA (LABS, IMAGING, TESTING) - I reviewed patient records, labs, notes, testing and imaging myself where available.  Lab Results  Component Value Date   WBC 7.4 10/25/2009   HGB 12.2  10/25/2009   HCT 35.0 (L) 10/25/2009   MCV 96.8 10/25/2009   PLT 236 10/25/2009      Component Value Date/Time   NA 140 06/25/2017 1502   K 4.1 06/25/2017 1502   CL 105 06/25/2017 1502   CO2 24 06/25/2017 1502   GLUCOSE 92 06/25/2017 1502   BUN 15 06/25/2017 1502   CREATININE 1.20 (H) 06/25/2017 1502   CALCIUM 8.7 06/25/2017 1502   PROT 6.6 06/25/2017 1502   ALBUMIN 4.1 06/25/2017 1502   AST 19 06/25/2017 1502   ALT 9 06/25/2017 1502   ALKPHOS 68 06/25/2017 1502   BILITOT 0.7 06/25/2017 1502   GFRNONAA 48 (L) 06/25/2017 1502   GFRAA 55 (L) 06/25/2017 1502   Lab Results  Component Value Date   CHOL 140 06/25/2017   HDL 49 (L) 06/25/2017   LDLCALC 75 06/25/2017   TRIG 80 06/25/2017   CHOLHDL 2.9 06/25/2017   No results found for: HGBA1C Lab Results  Component Value Date   VITAMINB12 768 06/25/2017   No results found for: TSH   12/20/09 MRI brain  - Moderate small vessel disease, likely secondary to hypertension. - No acute intracranial abnormality.  12/20/09 MRA head  - Unremarkable MR angiography of the intracranial circulation.  08/21/15 CT head  1.No CT evidence of an acute intracranial abnormality or mass lesion.  2.Left fetal-type PCA.     ASSESSMENT AND PLAN  66 y.o. year old female here with increasing headaches since 2017. History of right trigeminal neuralgia, s/p microvascular vascular decompression (2016).    Ddx: migraine without aura vs secondary headache  1. Migraine with aura and without status migrainosus, not intractable   2. Decreased appetite   3. Other vascular headache   4. Insomnia, unspecified type      PLAN:  - check MRI brain to rule out secondary causes of headache  - consider depakote for migraine prevention (also can stabilize mood and increase  appetite); will avoid topiramate because pt already has low appetite; will avoid propranolol due to lightheadedness; will avoid TCAs due to age and current SSRI use - To  prevent or relieve headaches, try the following:  Cool Compress. Lie down and place a cool compress on your head.   Avoid headache triggers. If certain foods or odors seem to have triggered your migraines in the past, avoid them. A headache diary might help you identify triggers.   Include physical activity in your daily routine.   Manage stress. Find healthy ways to cope with the stressors, such as delegating tasks on your to-do list.   Practice relaxation techniques. Try deep breathing, yoga, massage and visualization.   Eat regularly. Eating regularly scheduled meals and maintaining a healthy diet might help prevent headaches. Also, drink plenty of fluids.   Follow a regular sleep schedule. Sleep deprivation might contribute to headaches  Consider biofeedback. With this mind-body technique, you learn to control certain bodily functions - such as muscle tension, heart rate and blood pressure - to prevent headaches or reduce headache pain.  Orders Placed This Encounter  Procedures  . MR BRAIN W WO CONTRAST   Return in about 4 months (around 01/15/2018).    Penni Bombard, MD 08/19/9370, 6:96 PM Certified in Neurology, Neurophysiology and Neuroimaging  [facility] [address] [place], [place] [address] [phone]

## 2017-09-15 NOTE — Patient Instructions (Signed)
-   check MRI brain   - consider depakote for migraine prevention   - To prevent or relieve headaches, try the following:  Cool Compress. Lie down and place a cool compress on your head.   Avoid headache triggers. If certain foods or odors seem to have triggered your migraines in the past, avoid them. A headache diary might help you identify triggers.   Include physical activity in your daily routine.   Manage stress. Find healthy ways to cope with the stressors, such as delegating tasks on your to-do list.   Practice relaxation techniques. Try deep breathing, yoga, massage and visualization.   Eat regularly. Eating regularly scheduled meals and maintaining a healthy diet might help prevent headaches. Also, drink plenty of fluids.   Follow a regular sleep schedule. Sleep deprivation might contribute to headaches  Consider biofeedback. With this mind-body technique, you learn to control certain bodily functions - such as muscle tension, heart rate and blood pressure - to prevent headaches or reduce headache pain.

## 2017-09-25 ENCOUNTER — Telehealth: Payer: Self-pay | Admitting: Family Medicine

## 2017-09-25 NOTE — Telephone Encounter (Signed)
Patient called to reschedule/cancel brain scan

## 2017-09-29 NOTE — Progress Notes (Signed)
Cardiology Office Note   Date:  10/01/2017   ID:  Adalaide, Jaskolski November 15, 1951, MRN [id number]  PCP:  Raylene Everts, MD  Cardiologist:   Jenkins Rouge, MD   No chief complaint on file.     History of Present Illness: Kelly Chambers is a 66 y.o. female who presents for consultation regarding CAD Referred by Dr Meda Coffee. PMH includes COPD ongoing smoking. Falls with poor balance and headaches being seen by neurology. Chronic pain syndrome anxiety and depression Lots of stress and some SSCP for which she has taken nitro .   Vague history in chart of CAD with previous stent placement No records in Epic   Did find cath report from [facility] with cath Dr Tina Griffiths and note Dr Rex Kras regarding PCI/Stent RCA for single vessel disease has not had close f/u since then  She gets atypical pain 2-3 x/month takes nitro on occasion wit relief. Pain is not always exertional Never lasts more Than a few minutes    Past Medical History:  Diagnosis Date  . Allergy    hay fever  . Anemia   . Anxiety   . Blood transfusion without reported diagnosis    c section  . Cataract    no surgery  . Clotting disorder ([place])   . COPD (chronic obstructive pulmonary disease) ([place])   . Depression   . Emphysema of lung ([place])   . GERD (gastroesophageal reflux disease)   . Hyperlipidemia   . Hypertension   . Osteoarthritis    back hips shoulders and hands  . Renal failure, chronic, stage 3 (moderate) ([place]) 06/26/2017  . Stroke ([place])   . Thyroid disease     Past Surgical History:  Procedure Laterality Date  . ABDOMINAL HYSTERECTOMY     uncertain  . CARDIAC CATHETERIZATION    . CESAREAN SECTION    . CHOLECYSTECTOMY    . CORONARY ANGIOPLASTY WITH STENT PLACEMENT    . TRIGEMINAL NERVE DECOMPRESSION Right 2016     Current Outpatient Prescriptions  Medication Sig Dispense Refill  . albuterol (PROVENTIL HFA;VENTOLIN HFA) 108 (90 Base) MCG/ACT inhaler Inhale into the lungs.    . ALPRAZolam (XANAX)  0.5 MG tablet Take 1 tablet (0.5 mg total) by mouth 3 (three) times daily as needed for anxiety. 270 tablet 1  . amLODipine (NORVASC) 5 MG tablet Take 2.5 mg by mouth daily.    Marland Kitchen aspirin 81 MG chewable tablet Chew by mouth.    Marland Kitchen atorvastatin (LIPITOR) 10 MG tablet Take by mouth.    . budesonide-formoterol (SYMBICORT) 160-4.5 MCG/ACT inhaler Inhale 1 puff into the lungs 2 (two) times daily. 1 Inhaler 11  . cholecalciferol (VITAMIN D) 1000 units tablet Take 1,000 Units by mouth daily.    Marland Kitchen FLUoxetine (PROZAC) 40 MG capsule Take 1 capsule (40 mg total) by mouth daily. 90 capsule 2  . levothyroxine (SYNTHROID, LEVOTHROID) 75 MCG tablet Take by mouth.    . magic mouthwash SOLN Take 5 mLs by mouth 3 (three) times daily. 150 mL 0  . Multiple Vitamin (THERA) TABS Take by mouth.    . naproxen sodium (ANAPROX) 220 MG tablet Take 220 mg by mouth 2 (two) times daily with a meal.    . nitroGLYCERIN (NITROSTAT) 0.4 MG SL tablet Place under the tongue.    Marland Kitchen omeprazole (PRILOSEC) 20 MG capsule Take 1 capsule (20 mg total) by mouth daily. 90 capsule 3  . tiotropium (SPIRIVA) 18 MCG inhalation capsule Place 1 capsule (  18 mcg total) into inhaler and inhale as needed. 90 capsule 3  . traZODone (DESYREL) 50 MG tablet Take 1 tablet (50 mg total) by mouth at bedtime. 90 tablet 2   No current facility-administered medications for this visit.     Allergies:   Penicillins    Social History:  The patient  reports that she has been smoking E-cigarettes.  She has been smoking about 0.50 packs per day. She has quit using smokeless tobacco. She reports that she drinks alcohol. She reports that she does not use drugs.   Family History:  The patient's family history includes Alcohol abuse in her father, mother, and sister; Alzheimer's disease in her sister; Arthritis in her father and mother; Cancer in her father, maternal grandfather, and mother; Depression in her sister; Early death (age of onset: 46) in her mother; Mental  retardation in her mother and sister; Multiple sclerosis in her sister.    ROS:  Please see the history of present illness.   Otherwise, review of systems are positive for none.   All other systems are reviewed and negative.    PHYSICAL EXAM: VS:  BP 136/78 (BP Location: Right Arm)   Pulse 66   Ht 4\' 9"  (1.448 m)   Wt 132 lb (59.9 kg)   SpO2 97%   BMI 28.56 kg/m  , BMI Body mass index is 28.56 kg/m. Affect appropriate Chronically ill white female  HEENT: normal Neck supple with no adenopathy JVP normal no bruits no thyromegaly Lungs clear with no wheezing and good diaphragmatic motion Heart:  S1/S2 no murmur, no rub, gallop or click PMI normal Abdomen: benighn, BS positve, no tenderness, no AAA no bruit.  No HSM or HJR Distal pulses intact with no bruits No edema Neuro non-focal Skin warm and dry No muscular weakness    EKG:  None available and not done by tech    Recent Labs: 06/25/2017: ALT 9; BUN 15; Creat 1.20; Potassium 4.1; Sodium 140    Lipid Panel    Component Value Date/Time   CHOL 140 06/25/2017 1502   TRIG 80 06/25/2017 1502   HDL 49 (L) 06/25/2017 1502   CHOLHDL 2.9 06/25/2017 1502   VLDL 16 06/25/2017 1502   LDLCALC 75 06/25/2017 1502      Wt Readings from Last 3 Encounters:  10/01/17 132 lb (59.9 kg)  09/15/17 125 lb (56.7 kg)  09/11/17 125 lb 1.3 oz (56.7 kg)      Other studies Reviewed: Additional studies/ records that were reviewed today include: Notes [facility] 2010 notes primary and neurology October 2018.    ASSESSMENT AND PLAN:  1.  CAD: Distant stent to RCA 2010 history ambiguous f/u lexiscan myovue  2. Smoking /COPD discussed cessation she has no motivation to quit needs f/u CXR with primary  3. Neurology poor balance MRI schedule this is unlikely to be related to her heart  4. HTN Well controlled.  Continue current medications and low sodium Dash type diet.   5. Thyroid continue replacement TSH normal  6. Cholesterol continue  statin at target for vascular disease patient  Lab Results  Component Value Date   LDLCALC 75 06/25/2017      Current medicines are reviewed at length with the patient today.  The patient does not have concerns regarding medicines.  The following changes have been made:  no change  Labs/ tests ordered today include: Lexiscan myovue and echo   Orders Placed This Encounter  Procedures  . NM Myocar Multi  W/Spect W/Wall Motion / EF  . ECHOCARDIOGRAM COMPLETE     Disposition:   FU with cardiology in a year      Signed, Jenkins Rouge, MD  10/01/2017 11:27 AM    [place] [address], [place], [place]  [address] Phone: [phone]; Fax: [phone]

## 2017-09-29 NOTE — Telephone Encounter (Signed)
Left message for Janyla to call office, and number to resch. appt.

## 2017-10-01 ENCOUNTER — Encounter: Payer: Self-pay | Admitting: Cardiovascular Disease

## 2017-10-01 ENCOUNTER — Ambulatory Visit (INDEPENDENT_AMBULATORY_CARE_PROVIDER_SITE_OTHER): Payer: Medicare Other | Admitting: Cardiovascular Disease

## 2017-10-01 VITALS — BP 136/78 | HR 66 | Ht <= 58 in | Wt 132.0 lb

## 2017-10-01 DIAGNOSIS — R079 Chest pain, unspecified: Secondary | ICD-10-CM | POA: Diagnosis not present

## 2017-10-01 DIAGNOSIS — R0600 Dyspnea, unspecified: Secondary | ICD-10-CM

## 2017-10-01 NOTE — Patient Instructions (Signed)
Medication Instructions:  Your physician recommends that you continue on your current medications as directed. Please refer to the Current Medication list given to you today.   Labwork: NONE  Testing/Procedures: Your physician has requested that you have an echocardiogram. Echocardiography is a painless test that uses sound waves to create images of your heart. It provides your doctor with information about the size and shape of your heart and how well your heart's chambers and valves are working. This procedure takes approximately one hour. There are no restrictions for this procedure.  Your physician has requested that you have a lexiscan myoview. For further information please visit [email or web address]. Please follow instruction sheet, as given.    Follow-Up: Your physician wants you to follow-up in: 1 YEAR.  You will receive a reminder letter in the mail two months in advance. If you don't receive a letter, please call our office to schedule the follow-up appointment.   Any Other Special Instructions Will Be Listed Below (If Applicable).     If you need a refill on your cardiac medications before your next appointment, please call your pharmacy.

## 2017-10-02 ENCOUNTER — Ambulatory Visit
Admission: RE | Admit: 2017-10-02 | Discharge: 2017-10-02 | Disposition: A | Discharge status: Home / Self Care | Payer: Medicare Other | Source: Ambulatory Visit | Attending: Diagnostic Neuroimaging | Admitting: Diagnostic Neuroimaging

## 2017-10-02 DIAGNOSIS — R630 Anorexia: Secondary | ICD-10-CM

## 2017-10-02 DIAGNOSIS — G441 Vascular headache, not elsewhere classified: Secondary | ICD-10-CM | POA: Diagnosis not present

## 2017-10-02 DIAGNOSIS — R51 Headache: Secondary | ICD-10-CM | POA: Diagnosis not present

## 2017-10-02 MED ORDER — GADOBENATE DIMEGLUMINE 529 MG/ML IV SOLN
12.0000 mL | Freq: Once | INTRAVENOUS | Status: AC | PRN
  Administered 2017-10-02: 12 mL via INTRAVENOUS

## 2017-10-12 ENCOUNTER — Ambulatory Visit (INDEPENDENT_AMBULATORY_CARE_PROVIDER_SITE_OTHER): Payer: Medicare Other | Admitting: Otolaryngology

## 2017-10-12 DIAGNOSIS — H9313 Tinnitus, bilateral: Secondary | ICD-10-CM

## 2017-10-12 DIAGNOSIS — H903 Sensorineural hearing loss, bilateral: Secondary | ICD-10-CM

## 2017-10-12 DIAGNOSIS — F17210 Nicotine dependence, cigarettes, uncomplicated: Secondary | ICD-10-CM

## 2017-10-12 DIAGNOSIS — R070 Pain in throat: Secondary | ICD-10-CM

## 2017-10-15 ENCOUNTER — Other Ambulatory Visit (HOSPITAL_COMMUNITY): Payer: Self-pay

## 2017-10-15 ENCOUNTER — Encounter (HOSPITAL_COMMUNITY): Payer: Self-pay

## 2017-10-29 ENCOUNTER — Ambulatory Visit (INDEPENDENT_AMBULATORY_CARE_PROVIDER_SITE_OTHER): Payer: Medicare Other | Admitting: Psychiatry

## 2017-10-29 ENCOUNTER — Encounter (HOSPITAL_COMMUNITY): Payer: Self-pay | Admitting: Psychiatry

## 2017-10-29 VITALS — BP 162/92 | HR 75 | Ht <= 58 in | Wt 130.0 lb

## 2017-10-29 DIAGNOSIS — F4310 Post-traumatic stress disorder, unspecified: Secondary | ICD-10-CM | POA: Diagnosis not present

## 2017-10-29 MED ORDER — ALPRAZOLAM 0.5 MG PO TABS: 0.5000 mg | ORAL_TABLET | Freq: Three times a day (TID) | ORAL | 1 refills | Status: DC | PRN

## 2017-10-29 MED ORDER — TRAZODONE HCL 50 MG PO TABS: 50.0000 mg | ORAL_TABLET | Freq: Every day | ORAL | 2 refills | Status: DC

## 2017-10-29 MED ORDER — FLUOXETINE HCL 40 MG PO CAPS: 40.0000 mg | ORAL_CAPSULE | Freq: Every day | ORAL | 2 refills | Status: DC
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
## 2017-11-03 ENCOUNTER — Encounter (HOSPITAL_COMMUNITY)
Admission: RE | Admit: 2017-11-03 | Discharge: 2017-11-03 | Disposition: A | Discharge status: Home / Self Care | Payer: Medicare Other | Source: Ambulatory Visit | Attending: Cardiovascular Disease | Admitting: Cardiovascular Disease

## 2017-11-03 ENCOUNTER — Encounter (HOSPITAL_COMMUNITY): Payer: Self-pay

## 2017-11-03 ENCOUNTER — Ambulatory Visit (HOSPITAL_COMMUNITY)
Admission: RE | Admit: 2017-11-03 | Discharge: 2017-11-03 | Disposition: A | Discharge status: Home / Self Care | Payer: Medicare Other | Source: Ambulatory Visit | Attending: Cardiovascular Disease | Admitting: Cardiovascular Disease

## 2017-11-03 DIAGNOSIS — R079 Chest pain, unspecified: Secondary | ICD-10-CM | POA: Diagnosis not present

## 2017-11-03 DIAGNOSIS — E785 Hyperlipidemia, unspecified: Secondary | ICD-10-CM | POA: Diagnosis not present

## 2017-11-03 DIAGNOSIS — I10 Essential (primary) hypertension: Secondary | ICD-10-CM | POA: Insufficient documentation

## 2017-11-03 DIAGNOSIS — F4310 Post-traumatic stress disorder, unspecified: Secondary | ICD-10-CM | POA: Diagnosis not present

## 2017-11-03 DIAGNOSIS — R0600 Dyspnea, unspecified: Secondary | ICD-10-CM | POA: Insufficient documentation

## 2017-11-03 DIAGNOSIS — Z720 Tobacco use: Secondary | ICD-10-CM | POA: Diagnosis not present

## 2017-11-03 DIAGNOSIS — I2510 Atherosclerotic heart disease of native coronary artery without angina pectoris: Secondary | ICD-10-CM | POA: Diagnosis not present

## 2017-11-03 LAB — NM MYOCAR MULTI W/SPECT W/WALL MOTION / EF
CHL CUP RESTING HR STRESS: 57 {beats}/min
CSEPPHR: 93 {beats}/min
LV sys vol: 10 mL
LVDIAVOL: 51 mL (ref 46–106)
NUC STRESS TID: 0.91
RATE: 0.37
SDS: 1
SRS: 1
SSS: 2

## 2017-11-03 MED ORDER — TECHNETIUM TC 99M TETROFOSMIN IV KIT
30.0000 | PACK | Freq: Once | INTRAVENOUS | Status: AC | PRN
  Administered 2017-11-03: 24.4 via INTRAVENOUS

## 2017-11-03 MED ORDER — TECHNETIUM TC 99M TETROFOSMIN IV KIT
10.0000 | PACK | Freq: Once | INTRAVENOUS | Status: AC | PRN
  Administered 2017-11-03: 7.5 via INTRAVENOUS

## 2017-11-03 MED ORDER — SODIUM CHLORIDE 0.9% FLUSH
INTRAVENOUS | Status: AC
  Administered 2017-11-03: 10 mL via INTRAVENOUS
  Filled 2017-11-03: qty 10

## 2017-11-03 MED ORDER — REGADENOSON 0.4 MG/5ML IV SOLN
INTRAVENOUS | Status: AC
  Administered 2017-11-03: 0.4 mg via INTRAVENOUS
  Filled 2017-11-03: qty 5

## 2017-11-03 NOTE — Progress Notes (Signed)
*  PRELIMINARY RESULTS* Echocardiogram 2D Echocardiogram has been performed.  [name] 11/03/2017, 12:10 PM

## 2017-11-18 ENCOUNTER — Other Ambulatory Visit: Payer: Self-pay

## 2017-11-18 ENCOUNTER — Ambulatory Visit (INDEPENDENT_AMBULATORY_CARE_PROVIDER_SITE_OTHER): Payer: Medicare Other | Admitting: Family Medicine

## 2017-11-18 ENCOUNTER — Encounter: Payer: Self-pay | Admitting: Family Medicine

## 2017-11-18 VITALS — BP 136/82 | HR 68 | Temp 98.8°F | Resp 18 | Ht <= 58 in | Wt 133.0 lb

## 2017-11-18 DIAGNOSIS — J441 Chronic obstructive pulmonary disease with (acute) exacerbation: Secondary | ICD-10-CM | POA: Diagnosis not present

## 2017-11-18 MED ORDER — AZITHROMYCIN 250 MG PO TABS: ORAL_TABLET | ORAL | 0 refills | Status: DC

## 2017-11-18 MED ORDER — BENZONATATE 100 MG PO CAPS: 100.0000 mg | ORAL_CAPSULE | Freq: Two times a day (BID) | ORAL | 0 refills | Status: DC | PRN

## 2017-11-18 MED ORDER — PREDNISONE 20 MG PO TABS: 20.0000 mg | ORAL_TABLET | Freq: Two times a day (BID) | ORAL | 0 refills | Status: DC

## 2017-11-18 NOTE — Progress Notes (Signed)
Chief Complaint  Patient presents with  . URI    x 1 week   Patient has COPD.  She is compliant with her inhalers.  She continues to smoke cigarettes.  She is here for an upper respiratory infection.  She has had progressive coughing, wheezing, shortness of breath over about a 1 week period of time.  She is coughing up yellow and green sputum.  She had a low-grade fever.  No nausea or vomiting.  No runny stuffy nose or cold symptoms.  No known exposure to illness or pneumonia.  She has up-to-date with pneumonia and flu shots.  Patient Active Problem List   Diagnosis Date Noted  . Renal failure, chronic, stage 3 (moderate) ([place]) 06/26/2017  . Tobacco abuse 06/25/2017  . COPD with emphysema ([place]) 06/25/2017  . CAD (coronary artery disease) 06/25/2017  . Hx of heart artery stent 06/25/2017  . HLD (hyperlipidemia) 06/25/2017  . Essential hypertension 06/25/2017  . Post traumatic stress disorder 03/26/2016    Outpatient Encounter Medications as of 11/18/2017  Medication Sig  . albuterol (PROVENTIL HFA;VENTOLIN HFA) 108 (90 Base) MCG/ACT inhaler Inhale into the lungs.  . ALPRAZolam (XANAX) 0.5 MG tablet Take 1 tablet (0.5 mg total) 3 (three) times daily as needed by mouth for anxiety.  Marland Kitchen amLODipine (NORVASC) 5 MG tablet Take 2.5 mg by mouth daily.  Marland Kitchen aspirin 81 MG chewable tablet Chew by mouth.  Marland Kitchen atorvastatin (LIPITOR) 10 MG tablet Take by mouth.  . budesonide-formoterol (SYMBICORT) 160-4.5 MCG/ACT inhaler Inhale 1 puff into the lungs 2 (two) times daily.  . cholecalciferol (VITAMIN D) 1000 units tablet Take 1,000 Units by mouth daily.  Marland Kitchen FLUoxetine (PROZAC) 40 MG capsule Take 1 capsule (40 mg total) daily by mouth.  . levothyroxine (SYNTHROID, LEVOTHROID) 75 MCG tablet Take by mouth.  . magic mouthwash SOLN Take 5 mLs by mouth 3 (three) times daily.  . Multiple Vitamin (THERA) TABS Take by mouth.  . naproxen sodium (ANAPROX) 220 MG tablet Take 220 mg by mouth 2 (two) times daily  with a meal.  . nitroGLYCERIN (NITROSTAT) 0.4 MG SL tablet Place under the tongue.  Marland Kitchen omeprazole (PRILOSEC) 20 MG capsule Take 1 capsule (20 mg total) by mouth daily.  Marland Kitchen tiotropium (SPIRIVA) 18 MCG inhalation capsule Place 1 capsule (18 mcg total) into inhaler and inhale as needed.  . traZODone (DESYREL) 50 MG tablet Take 1 tablet (50 mg total) at bedtime by mouth.  Marland Kitchen azithromycin (ZITHROMAX) 250 MG tablet tad  . benzonatate (TESSALON) 100 MG capsule Take 1 capsule (100 mg total) by mouth 2 (two) times daily as needed for cough.  . predniSONE (DELTASONE) 20 MG tablet Take 1 tablet (20 mg total) by mouth 2 (two) times daily with a meal.   No facility-administered encounter medications on file as of 11/18/2017.     Allergies  Allergen Reactions  . Penicillins Itching    Skin itching    Review of Systems  Respiratory: Positive for cough, shortness of breath and stridor.   All other systems reviewed and are negative. See HPI    BP 136/82 (BP Location: Left Arm, Patient Position: Sitting, Cuff Size: Normal)   Pulse 68   Temp 98.8 F (37.1 C) (Temporal)   Resp 18   Ht 4\' 9"  (1.448 m)   Wt 133 lb 0.6 oz (60.3 kg)   SpO2 95%   BMI 28.79 kg/m   Physical Exam  Constitutional: She appears well-developed and well-nourished. No distress.  Moderately  ill, appears fatigued  HENT:  Head: Normocephalic.  Right Ear: External ear normal.  Left Ear: External ear normal.  Tonsils are red  Eyes: Conjunctivae are normal. Pupils are equal, round, and reactive to light.  Neck: Normal range of motion.  Cardiovascular: Normal rate, regular rhythm and normal heart sounds.  Pulmonary/Chest: Effort normal. She has wheezes. She has no rales.  Abdominal: Soft. Bowel sounds are normal.  Musculoskeletal: Normal range of motion. She exhibits no edema.  Lymphadenopathy:    She has cervical adenopathy.  Psychiatric: She has a normal mood and affect. Her behavior is normal.    ASSESSMENT/PLAN:  1.  COPD with acute exacerbation ([place]) Discussed that this is likely caused by a virus, but because of her COPD I am going to cover her with an antibiotic.  She also needs prednisone.  I am giving her something for cough.  She is to try to quit smoking.  Push fluids.  Call me if not better by next week   Patient Instructions  Need to drink plenty of water Continue inhalers Take the azithromycin as directed This is an antibiotic Take the tessalon for cough Take the prednisone 2 X  a day  Call into better in a few days  See me in the next few weeks for discussion about the chest pain and the dog issue   Raylene Everts, MD

## 2017-11-18 NOTE — Patient Instructions (Signed)
Need to drink plenty of water Continue inhalers Take the azithromycin as directed This is an antibiotic Take the tessalon for cough Take the prednisone 2 X  a day  Call into better in a few days  See me in the next few weeks for discussion about the chest pain and the dog issue

## 2017-11-25 ENCOUNTER — Encounter: Payer: Self-pay | Admitting: Family Medicine

## 2017-12-16 ENCOUNTER — Ambulatory Visit: Payer: Self-pay | Admitting: Family Medicine

## 2018-01-14 ENCOUNTER — Ambulatory Visit (INDEPENDENT_AMBULATORY_CARE_PROVIDER_SITE_OTHER): Payer: Self-pay | Admitting: Otolaryngology

## 2018-01-16 IMAGING — NM NM MYOCAR MULTI W/SPECT W/WALL MOTION & EF
2 series · 12 of 12 positions shown · non-contrast
Comparison: none

[Series 1: rest · 6.51mm/px · 6 of 64 frames shown]
[frame 6/64]
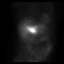
[frame 16/64]
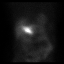
[frame 27/64]
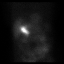
[frame 38/64]
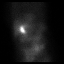
[frame 48/64]
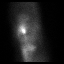
[frame 59/64]
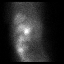

[Series 2: stress gated · 6.51mm/px · 6 of 64 frames shown]
[frame 6/64]
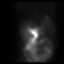
[frame 16/64]
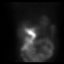
[frame 27/64]
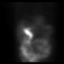
[frame 38/64]
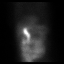
[frame 48/64]
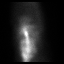
[frame 59/64]
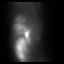

[12 of 12 positions shown; findings below may reference images not displayed]

Canned report from images found in remote index.

Refer to host system for actual result text.

## 2018-01-20 ENCOUNTER — Ambulatory Visit: Payer: Medicare Other | Admitting: Diagnostic Neuroimaging

## 2018-01-20 ENCOUNTER — Telehealth: Payer: Self-pay | Admitting: *Deleted

## 2018-01-20 NOTE — Telephone Encounter (Signed)
Pt no showed for appt today.  

## 2018-01-22 ENCOUNTER — Other Ambulatory Visit: Payer: Self-pay | Admitting: Family Medicine

## 2018-01-22 MED ORDER — AZITHROMYCIN 250 MG PO TABS: ORAL_TABLET | ORAL | 0 refills | Status: DC

## 2018-01-25 ENCOUNTER — Encounter: Payer: Self-pay | Admitting: Diagnostic Neuroimaging

## 2018-01-28 ENCOUNTER — Ambulatory Visit (HOSPITAL_COMMUNITY): Payer: Self-pay | Admitting: Psychiatry

## 2018-02-01 ENCOUNTER — Ambulatory Visit: Payer: Self-pay | Admitting: Family Medicine

## 2018-03-01 ENCOUNTER — Encounter: Payer: Self-pay | Admitting: Family Medicine

## 2018-03-25 ENCOUNTER — Encounter (HOSPITAL_COMMUNITY): Payer: Self-pay | Admitting: Psychiatry

## 2018-03-25 ENCOUNTER — Ambulatory Visit (INDEPENDENT_AMBULATORY_CARE_PROVIDER_SITE_OTHER): Payer: Self-pay | Admitting: Psychiatry

## 2018-03-25 VITALS — BP 150/86 | HR 84 | Ht <= 58 in | Wt 120.0 lb

## 2018-03-25 DIAGNOSIS — Z811 Family history of alcohol abuse and dependence: Secondary | ICD-10-CM

## 2018-03-25 DIAGNOSIS — Z62811 Personal history of psychological abuse in childhood: Secondary | ICD-10-CM

## 2018-03-25 DIAGNOSIS — F419 Anxiety disorder, unspecified: Secondary | ICD-10-CM

## 2018-03-25 DIAGNOSIS — G4700 Insomnia, unspecified: Secondary | ICD-10-CM

## 2018-03-25 DIAGNOSIS — Z736 Limitation of activities due to disability: Secondary | ICD-10-CM

## 2018-03-25 DIAGNOSIS — Z818 Family history of other mental and behavioral disorders: Secondary | ICD-10-CM

## 2018-03-25 DIAGNOSIS — Z810 Family history of intellectual disabilities: Secondary | ICD-10-CM

## 2018-03-25 DIAGNOSIS — R450 Nervousness: Secondary | ICD-10-CM

## 2018-03-25 DIAGNOSIS — F17210 Nicotine dependence, cigarettes, uncomplicated: Secondary | ICD-10-CM

## 2018-03-25 DIAGNOSIS — Z6229 Other upbringing away from parents: Secondary | ICD-10-CM

## 2018-03-25 DIAGNOSIS — F4310 Post-traumatic stress disorder, unspecified: Secondary | ICD-10-CM

## 2018-03-25 DIAGNOSIS — Z62810 Personal history of physical and sexual abuse in childhood: Secondary | ICD-10-CM

## 2018-03-25 MED ORDER — ALPRAZOLAM 0.5 MG PO TABS: 0.5000 mg | ORAL_TABLET | Freq: Three times a day (TID) | ORAL | 2 refills | Status: DC | PRN

## 2018-03-25 MED ORDER — TRAZODONE HCL 50 MG PO TABS: 50.0000 mg | ORAL_TABLET | Freq: Every day | ORAL | 2 refills | Status: DC

## 2018-03-25 MED ORDER — FLUOXETINE HCL 20 MG PO CAPS: 20.0000 mg | ORAL_CAPSULE | Freq: Every day | ORAL | 2 refills | Status: DC
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
## 2018-05-25 ENCOUNTER — Ambulatory Visit (HOSPITAL_COMMUNITY): Payer: Self-pay | Admitting: Psychiatry

## 2018-06-29 DIAGNOSIS — N183 Chronic kidney disease, stage 3 (moderate): Secondary | ICD-10-CM | POA: Diagnosis not present

## 2018-06-29 DIAGNOSIS — I10 Essential (primary) hypertension: Secondary | ICD-10-CM | POA: Diagnosis not present

## 2018-06-29 DIAGNOSIS — J449 Chronic obstructive pulmonary disease, unspecified: Secondary | ICD-10-CM | POA: Diagnosis not present

## 2018-06-29 DIAGNOSIS — E785 Hyperlipidemia, unspecified: Secondary | ICD-10-CM | POA: Diagnosis not present

## 2018-06-29 DIAGNOSIS — F4310 Post-traumatic stress disorder, unspecified: Secondary | ICD-10-CM | POA: Diagnosis not present

## 2018-06-29 DIAGNOSIS — Z130 Encounter for screening for diseases of the blood and blood-forming organs and certain disorders involving the immune mechanism: Secondary | ICD-10-CM | POA: Diagnosis not present

## 2018-06-29 DIAGNOSIS — Z1159 Encounter for screening for other viral diseases: Secondary | ICD-10-CM | POA: Diagnosis not present

## 2018-06-29 DIAGNOSIS — Z23 Encounter for immunization: Secondary | ICD-10-CM | POA: Diagnosis not present

## 2018-06-29 DIAGNOSIS — E079 Disorder of thyroid, unspecified: Secondary | ICD-10-CM | POA: Diagnosis not present

## 2018-06-29 DIAGNOSIS — F419 Anxiety disorder, unspecified: Secondary | ICD-10-CM | POA: Diagnosis not present

## 2018-06-29 DIAGNOSIS — F329 Major depressive disorder, single episode, unspecified: Secondary | ICD-10-CM | POA: Diagnosis not present

## 2018-06-29 DIAGNOSIS — G43019 Migraine without aura, intractable, without status migrainosus: Secondary | ICD-10-CM | POA: Diagnosis not present

## 2018-06-30 DIAGNOSIS — G8929 Other chronic pain: Secondary | ICD-10-CM | POA: Diagnosis not present

## 2018-06-30 DIAGNOSIS — M5136 Other intervertebral disc degeneration, lumbar region: Secondary | ICD-10-CM | POA: Diagnosis not present

## 2018-06-30 DIAGNOSIS — M533 Sacrococcygeal disorders, not elsewhere classified: Secondary | ICD-10-CM | POA: Diagnosis not present

## 2018-07-22 DIAGNOSIS — M810 Age-related osteoporosis without current pathological fracture: Secondary | ICD-10-CM | POA: Diagnosis not present

## 2018-07-22 DIAGNOSIS — M818 Other osteoporosis without current pathological fracture: Secondary | ICD-10-CM | POA: Diagnosis not present

## 2018-07-22 DIAGNOSIS — Z780 Asymptomatic menopausal state: Secondary | ICD-10-CM | POA: Diagnosis not present

## 2018-08-02 DIAGNOSIS — Z7982 Long term (current) use of aspirin: Secondary | ICD-10-CM | POA: Diagnosis not present

## 2018-08-02 DIAGNOSIS — R2231 Localized swelling, mass and lump, right upper limb: Secondary | ICD-10-CM | POA: Diagnosis not present

## 2018-08-02 DIAGNOSIS — I2510 Atherosclerotic heart disease of native coronary artery without angina pectoris: Secondary | ICD-10-CM | POA: Diagnosis not present

## 2018-08-02 DIAGNOSIS — F319 Bipolar disorder, unspecified: Secondary | ICD-10-CM | POA: Diagnosis not present

## 2018-08-02 DIAGNOSIS — K219 Gastro-esophageal reflux disease without esophagitis: Secondary | ICD-10-CM | POA: Diagnosis not present

## 2018-08-02 DIAGNOSIS — J449 Chronic obstructive pulmonary disease, unspecified: Secondary | ICD-10-CM | POA: Diagnosis not present

## 2018-08-02 DIAGNOSIS — L509 Urticaria, unspecified: Secondary | ICD-10-CM | POA: Diagnosis not present

## 2018-08-02 DIAGNOSIS — Z79899 Other long term (current) drug therapy: Secondary | ICD-10-CM | POA: Diagnosis not present

## 2018-08-02 DIAGNOSIS — I10 Essential (primary) hypertension: Secondary | ICD-10-CM | POA: Diagnosis not present

## 2018-08-04 DIAGNOSIS — N6311 Unspecified lump in the right breast, upper outer quadrant: Secondary | ICD-10-CM | POA: Diagnosis not present

## 2018-08-04 DIAGNOSIS — N6321 Unspecified lump in the left breast, upper outer quadrant: Secondary | ICD-10-CM | POA: Diagnosis not present

## 2018-08-04 DIAGNOSIS — N644 Mastodynia: Secondary | ICD-10-CM | POA: Diagnosis not present

## 2018-08-04 DIAGNOSIS — N6489 Other specified disorders of breast: Secondary | ICD-10-CM | POA: Diagnosis not present

## 2018-08-04 DIAGNOSIS — R928 Other abnormal and inconclusive findings on diagnostic imaging of breast: Secondary | ICD-10-CM | POA: Diagnosis not present

## 2018-08-17 ENCOUNTER — Encounter

## 2018-08-20 ENCOUNTER — Ambulatory Visit: Payer: MEDICARE | Primary: Internal Medicine

## 2018-08-20 NOTE — Progress Notes (Signed)
No show nuclear stress test 08-20-2018.

## 2018-08-20 NOTE — Progress Notes (Signed)
No show nuclear stress test 08-20-2018.

## 2018-08-21 ENCOUNTER — Inpatient Hospital Stay: Payer: MEDICARE | Attending: Cardiovascular Disease | Primary: Internal Medicine

## 2018-08-27 ENCOUNTER — Encounter

## 2018-08-27 ENCOUNTER — Ambulatory Visit (HOSPITAL_COMMUNITY): Payer: Self-pay | Admitting: Psychiatry

## 2018-08-27 NOTE — Progress Notes (Signed)
No show nuclear stress test 08-20-2018.

## 2018-08-27 NOTE — Progress Notes (Signed)
No show nuclear stress test 08-20-2018.

## 2018-09-01 ENCOUNTER — Ambulatory Visit (INDEPENDENT_AMBULATORY_CARE_PROVIDER_SITE_OTHER): Payer: Medicare HMO | Admitting: Psychiatry

## 2018-09-01 ENCOUNTER — Encounter (HOSPITAL_COMMUNITY): Payer: Self-pay | Admitting: Psychiatry

## 2018-09-01 VITALS — BP 130/78 | HR 78 | Ht <= 58 in | Wt 126.0 lb

## 2018-09-01 DIAGNOSIS — F17290 Nicotine dependence, other tobacco product, uncomplicated: Secondary | ICD-10-CM | POA: Diagnosis not present

## 2018-09-01 DIAGNOSIS — F419 Anxiety disorder, unspecified: Secondary | ICD-10-CM

## 2018-09-01 DIAGNOSIS — Z811 Family history of alcohol abuse and dependence: Secondary | ICD-10-CM | POA: Diagnosis not present

## 2018-09-01 DIAGNOSIS — Z62811 Personal history of psychological abuse in childhood: Secondary | ICD-10-CM | POA: Diagnosis not present

## 2018-09-01 DIAGNOSIS — F4310 Post-traumatic stress disorder, unspecified: Secondary | ICD-10-CM

## 2018-09-01 DIAGNOSIS — Z818 Family history of other mental and behavioral disorders: Secondary | ICD-10-CM | POA: Diagnosis not present

## 2018-09-01 DIAGNOSIS — Z62810 Personal history of physical and sexual abuse in childhood: Secondary | ICD-10-CM | POA: Diagnosis not present

## 2018-09-01 DIAGNOSIS — Z810 Family history of intellectual disabilities: Secondary | ICD-10-CM | POA: Diagnosis not present

## 2018-09-01 MED ORDER — TRAZODONE HCL 50 MG PO TABS: 50.0000 mg | ORAL_TABLET | Freq: Every day | ORAL | 2 refills | Status: DC

## 2018-09-01 MED ORDER — FLUOXETINE HCL 20 MG PO CAPS: 20.0000 mg | ORAL_CAPSULE | Freq: Every day | ORAL | 2 refills | Status: DC

## 2018-09-01 MED ORDER — ALPRAZOLAM 0.5 MG PO TABS: 0.5000 mg | ORAL_TABLET | Freq: Three times a day (TID) | ORAL | 2 refills | Status: DC | PRN
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
## 2018-09-02 DIAGNOSIS — R252 Cramp and spasm: Secondary | ICD-10-CM | POA: Diagnosis not present

## 2018-09-02 DIAGNOSIS — R1012 Left upper quadrant pain: Secondary | ICD-10-CM | POA: Diagnosis not present

## 2018-09-02 DIAGNOSIS — R1902 Left upper quadrant abdominal swelling, mass and lump: Secondary | ICD-10-CM | POA: Diagnosis not present

## 2018-09-03 ENCOUNTER — Encounter: Primary: Internal Medicine

## 2018-09-03 ENCOUNTER — Inpatient Hospital Stay: Admit: 2018-09-03 | Payer: MEDICARE | Attending: Cardiovascular Disease | Primary: Internal Medicine

## 2018-09-03 DIAGNOSIS — I10 Essential (primary) hypertension: Secondary | ICD-10-CM

## 2018-09-03 LAB — NM MYOCARDIAL SPECT REST EXERCISE OR RX: Left Ventricular Ejection Fraction: 70

## 2018-09-03 LAB — TREADMILL STRESS TEST, W MD SUPERVISION AND REPORT
Max Diastolic BP: 80 mmHg
Max Heart Rate: 141 {beats}/min
Max Systolic BP: 200 mmHg
Peak EX METS: 1 METS
Reason for Termination: 83 {beats}/min

## 2018-09-03 LAB — STRESS TEST CARDIAC
Max. Diastolic BP: 80 mmHg
Max. Heart rate: 141 {beats}/min
Max. Systolic BP: 200 mmHg
Peak Ex METs: 1 METS
Reason for Termination: 83 {beats}/min

## 2018-09-03 MED ORDER — TECHNETIUM TC 99M TETROFOSMIN IV KIT
Freq: Once | Status: AC
Start: 2018-09-03 — End: 2018-09-03
  Administered 2018-09-03: 18:00:00 via INTRAVENOUS

## 2018-09-03 MED ORDER — REGADENOSON 0.4 MG/5 ML IV SYRINGE
0.4 mg/5 mL | Freq: Once | INTRAVENOUS | Status: AC
Start: 2018-09-03 — End: 2018-09-03
  Administered 2018-09-03: 18:00:00 via INTRAVENOUS

## 2018-09-03 MED ORDER — TECHNETIUM TC 99M TETROFOSMIN IV KIT
Freq: Once | Status: AC
Start: 2018-09-03 — End: 2018-09-03
  Administered 2018-09-03: 16:00:00 via INTRAVENOUS

## 2018-09-03 MED FILL — LEXISCAN 0.4 MG/5 ML INTRAVENOUS SYRINGE: 0.4 mg/5 mL | INTRAVENOUS | Qty: 5

## 2018-09-08 DIAGNOSIS — R1012 Left upper quadrant pain: Secondary | ICD-10-CM | POA: Diagnosis not present

## 2018-09-08 DIAGNOSIS — R1902 Left upper quadrant abdominal swelling, mass and lump: Secondary | ICD-10-CM | POA: Diagnosis not present

## 2018-09-22 ENCOUNTER — Encounter: Primary: Internal Medicine

## 2018-09-25 DIAGNOSIS — J449 Chronic obstructive pulmonary disease, unspecified: Secondary | ICD-10-CM | POA: Diagnosis not present

## 2018-09-25 DIAGNOSIS — S3992XA Unspecified injury of lower back, initial encounter: Secondary | ICD-10-CM | POA: Diagnosis not present

## 2018-09-25 DIAGNOSIS — W010XXA Fall on same level from slipping, tripping and stumbling without subsequent striking against object, initial encounter: Secondary | ICD-10-CM | POA: Diagnosis not present

## 2018-09-25 DIAGNOSIS — K219 Gastro-esophageal reflux disease without esophagitis: Secondary | ICD-10-CM | POA: Diagnosis not present

## 2018-09-25 DIAGNOSIS — E039 Hypothyroidism, unspecified: Secondary | ICD-10-CM | POA: Diagnosis not present

## 2018-09-25 DIAGNOSIS — I2510 Atherosclerotic heart disease of native coronary artery without angina pectoris: Secondary | ICD-10-CM | POA: Diagnosis not present

## 2018-09-25 DIAGNOSIS — S39012A Strain of muscle, fascia and tendon of lower back, initial encounter: Secondary | ICD-10-CM | POA: Diagnosis not present

## 2018-09-25 DIAGNOSIS — S0990XA Unspecified injury of head, initial encounter: Secondary | ICD-10-CM | POA: Diagnosis not present

## 2018-09-25 DIAGNOSIS — R51 Headache: Secondary | ICD-10-CM | POA: Diagnosis not present

## 2018-09-25 DIAGNOSIS — M545 Low back pain: Secondary | ICD-10-CM | POA: Diagnosis not present

## 2018-09-25 DIAGNOSIS — Z79899 Other long term (current) drug therapy: Secondary | ICD-10-CM | POA: Diagnosis not present

## 2018-09-25 DIAGNOSIS — Z880 Allergy status to penicillin: Secondary | ICD-10-CM | POA: Diagnosis not present

## 2018-09-25 DIAGNOSIS — F17200 Nicotine dependence, unspecified, uncomplicated: Secondary | ICD-10-CM | POA: Diagnosis not present

## 2018-09-25 DIAGNOSIS — I10 Essential (primary) hypertension: Secondary | ICD-10-CM | POA: Diagnosis not present

## 2018-09-28 ENCOUNTER — Encounter: Payer: Self-pay | Admitting: Physician Assistant

## 2018-09-28 ENCOUNTER — Other Ambulatory Visit (HOSPITAL_COMMUNITY)
Admission: RE | Admit: 2018-09-28 | Discharge: 2018-09-28 | Disposition: A | Discharge status: Home / Self Care | Payer: Medicare HMO | Source: Ambulatory Visit | Attending: Physician Assistant | Admitting: Physician Assistant

## 2018-09-28 ENCOUNTER — Ambulatory Visit: Payer: Medicare HMO | Admitting: Physician Assistant

## 2018-09-28 VITALS — BP 126/68 | HR 74 | Ht <= 58 in | Wt 126.8 lb

## 2018-09-28 DIAGNOSIS — I2510 Atherosclerotic heart disease of native coronary artery without angina pectoris: Secondary | ICD-10-CM

## 2018-09-28 DIAGNOSIS — Z79899 Other long term (current) drug therapy: Secondary | ICD-10-CM | POA: Diagnosis not present

## 2018-09-28 DIAGNOSIS — E785 Hyperlipidemia, unspecified: Secondary | ICD-10-CM | POA: Diagnosis not present

## 2018-09-28 DIAGNOSIS — I10 Essential (primary) hypertension: Secondary | ICD-10-CM

## 2018-09-28 LAB — LIPID PANEL
CHOL/HDL RATIO: 3.2 ratio
Cholesterol: 124 mg/dL (ref 0–200)
HDL: 39 mg/dL — AB (ref >40)
LDL Cholesterol: 72 mg/dL (ref 0–99)
TRIGLYCERIDES: 65 mg/dL (ref <150)
VLDL: 13 mg/dL (ref 0–40)

## 2018-09-28 LAB — COMPREHENSIVE METABOLIC PANEL
ALBUMIN: 4.3 g/dL (ref 3.5–5.0)
ALT: 15 U/L (ref 0–44)
ANION GAP: 9 (ref 5–15)
AST: 23 U/L (ref 15–41)
Alkaline Phosphatase: 66 U/L (ref 38–126)
BILIRUBIN TOTAL: 0.8 mg/dL (ref 0.3–1.2)
BUN: 12 mg/dL (ref 8–23)
CO2: 26 mmol/L (ref 22–32)
Calcium: 9 mg/dL (ref 8.9–10.3)
Chloride: 105 mmol/L (ref 98–111)
Creatinine, Ser: 0.96 mg/dL (ref 0.44–1.00)
GFR calc non Af Amer: >60 mL/min (ref >60)
GLUCOSE: 111 mg/dL — AB (ref 70–99)
POTASSIUM: 3.3 mmol/L — AB (ref 3.5–5.1)
SODIUM: 140 mmol/L (ref 135–145)
TOTAL PROTEIN: 7.5 g/dL (ref 6.5–8.1)

## 2018-09-28 NOTE — Progress Notes (Signed)
Cardiology Office Note   Date:  09/28/2018   ID:  Sierra, Spargo [birth year], MRN [id number]  PCP:  Raylene Everts, MD Cardiologist:  Jenkins Rouge, MD  Rosaria Ferries, PA-C   No chief complaint on file.   History of Present Illness: Kelly Chambers is a 67 y.o. female with a history of COPD, tob use, falls w/ balance probs, depression, stent RCA 2010, nl EF and MV 2018  10/01/2017 office visit, Lexi MV and echo ordered, both ok 09/02/2018, PCP visit, pt w/ LUQ abdominal pain, [place] was ok. 09/21/2018 phone notes from PCP re:CP>>hospital recommended 09/21/2018 Pt states went to [facility], ez ok, no cause for the pain found.  Kelly Chambers presents for cardiology follow up.  She is worried that she has mouth/throat cancer>>has PCP appt soon.   Activity level is poor, she feels that is from depression.  She does not have appt w/ Mental Health practitioner, admits she needs one.   She climbs a few stairs into her trailer and has to walk up a hill when she checks the mail. She has to stop several times coming up the hill from [place]. She also has back problems.   When she is less depressed, she is more active. Then, she gets less chest pain and her dyspnea on exertion improves.  She is busy now, she has 6 dogs and some kittens. She feels loved by her animals.   She gets occasional chest pain. Once in a while. Sometimes she has to take a nitro, one usually does it.  It is not consistently exertional.  It is not severe.  She was thinking of harming herself a few weeks ago, her dogs helped her.   A [employer] is opening up in [place], she may join. Her daughter may join as well.  Her attitude towards this is positive.  She has balance problems, has not been evaluated for them.  She is concerned about her balance.  She has back problems as well.   She denies lower extremity edema, orthopnea, or PND.  No recent labs by her PCP.   Past Medical History:  Diagnosis Date    . Allergy    hay fever  . Anemia   . Anxiety   . Blood transfusion without reported diagnosis    c section  . Cataract    no surgery  . Clotting disorder ([place])   . COPD (chronic obstructive pulmonary disease) ([place])   . Depression   . Emphysema of lung ([place])   . GERD (gastroesophageal reflux disease)   . Hyperlipidemia   . Hypertension   . Osteoarthritis    back hips shoulders and hands  . Renal failure, chronic, stage 3 (moderate) ([place]) 06/26/2017  . Stroke ([place])   . Thyroid disease     Past Surgical History:  Procedure Laterality Date  . ABDOMINAL HYSTERECTOMY     uncertain  . CARDIAC CATHETERIZATION    . CESAREAN SECTION    . CHOLECYSTECTOMY    . CORONARY ANGIOPLASTY WITH STENT PLACEMENT    . TRIGEMINAL NERVE DECOMPRESSION Right 2016    Current Outpatient Medications  Medication Sig Dispense Refill  . albuterol (PROVENTIL HFA;VENTOLIN HFA) 108 (90 Base) MCG/ACT inhaler Inhale into the lungs.    . ALPRAZolam (XANAX) 0.5 MG tablet Take 1 tablet (0.5 mg total) by mouth 3 (three) times daily as needed for anxiety. 90 tablet 2  . amLODipine (NORVASC) 5 MG tablet Take 2.5 mg by  mouth daily.    Marland Kitchen aspirin 81 MG chewable tablet Chew by mouth.    Marland Kitchen atorvastatin (LIPITOR) 10 MG tablet Take by mouth.    . benzonatate (TESSALON) 100 MG capsule Take 1 capsule (100 mg total) by mouth 2 (two) times daily as needed for cough. 20 capsule 0  . cholecalciferol (VITAMIN D) 1000 units tablet Take 1,000 Units by mouth daily.    Marland Kitchen FLUoxetine (PROZAC) 20 MG capsule Take 1 capsule (20 mg total) by mouth daily. 30 capsule 2  . levothyroxine (SYNTHROID, LEVOTHROID) 75 MCG tablet Take by mouth.    . magic mouthwash SOLN Take 5 mLs by mouth 3 (three) times daily. 150 mL 0  . Multiple Vitamin (THERA) TABS Take by mouth.    . naproxen sodium (ANAPROX) 220 MG tablet Take 220 mg by mouth 2 (two) times daily with a meal.    . nitroGLYCERIN (NITROSTAT) 0.4 MG SL tablet Place under the tongue.    Marland Kitchen  omeprazole (PRILOSEC) 20 MG capsule Take 1 capsule (20 mg total) by mouth daily. 90 capsule 3  . predniSONE (DELTASONE) 20 MG tablet Take 1 tablet (20 mg total) by mouth 2 (two) times daily with a meal. 10 tablet 0  . tiotropium (SPIRIVA) 18 MCG inhalation capsule Place 1 capsule (18 mcg total) into inhaler and inhale as needed. 90 capsule 3  . traZODone (DESYREL) 50 MG tablet Take 1 tablet (50 mg total) by mouth at bedtime. 30 tablet 2   No current facility-administered medications for this visit.     Allergies:   Penicillins    Social History:  The patient  reports that she has been smoking e-cigarettes. She has been smoking about 1.00 pack per day. She has quit using smokeless tobacco. She reports that she drinks alcohol. She reports that she does not use drugs.   Family History:  The patient's family history includes Alcohol abuse in her father, mother, and sister; Alzheimer's disease in her sister; Arthritis in her father and mother; Cancer in her father, maternal grandfather, and mother; Depression in her sister; Early death (age of onset: 22) in her mother; Mental retardation in her mother and sister; Multiple sclerosis in her sister.    ROS:  Please see the history of present illness. All other systems are reviewed and negative.    PHYSICAL EXAM: VS:  BP 126/68   Pulse 74   Ht 4\' 10"  (1.473 m)   Wt 126 lb 12.8 oz (57.5 kg)   SpO2 99%   BMI 26.50 kg/m  , BMI Body mass index is 26.5 kg/m. GEN: Well nourished, well developed, female in no acute distress HEENT: normal for age  Neck: no JVD, no carotid bruit, no masses Cardiac: RRR; soft murmur, no rubs, or gallops Respiratory:  clear to auscultation bilaterally, normal work of breathing GI: soft, nontender, nondistended, + BS MS: no deformity or atrophy; no edema; distal pulses are 2+ in all 4 extremities  Skin: warm and dry, no rash Neuro:  Strength and sensation are intact Psych: euthymic mood, full affect   EKG:  EKG is  ordered today. The ekg ordered today demonstrates SR, HR 69, normal intervals, no acute ischemic changes.   Recent Labs: No results found for requested labs within last 8760 hours.    Lipid Panel    Component Value Date/Time   CHOL 140 06/25/2017 1502   TRIG 80 06/25/2017 1502   HDL 49 (L) 06/25/2017 1502   CHOLHDL 2.9 06/25/2017 1502  VLDL 16 06/25/2017 1502   LDLCALC 75 06/25/2017 1502     Wt Readings from Last 3 Encounters:  09/28/18 126 lb 12.8 oz (57.5 kg)  11/18/17 133 lb 0.6 oz (60.3 kg)  10/01/17 132 lb (59.9 kg)     Other studies Reviewed: Additional studies/ records that were reviewed today include: Office notes, hospital records and testing.  ASSESSMENT AND PLAN:  1.  Hypertension: She is compliant with her medications and her blood pressure is well controlled.  No changes.  2.  CAD: She is not having consistent exertional symptoms.  I am not convinced that her chest pain is true angina.  She is on aspirin, statin and has sublingual nitroglycerin.  She has not been on a beta-blocker.  She may have a history of reactive airway disease, so will avoid.  3.  Dyslipidemia, goal LDL less than 70: She has not had a lipid profile in a year.  Go ahead and check this today.  Check a complete metabolic profile as well.  4.  Medication management: Check a complete metabolic profile and lipid profile   Current medicines are reviewed at length with the patient today.  The patient does not have concerns regarding medicines.  The following changes have been made:  no change  Labs/ tests ordered today include:   Orders Placed This Encounter  Procedures  . EKG 12-Lead     Disposition:   FU with Jenkins Rouge, MD in 1 year  Signed, Rosaria Ferries, PA-C  09/28/2018 3:32 PM    [place] Phone: [phone]; Fax: [phone]  This note was written with the assistance of speech recognition software.  Please excuse any transcriptional  errors.

## 2018-09-28 NOTE — Patient Instructions (Signed)
Medication Instructions:  Your physician recommends that you continue on your current medications as directed. Please refer to the Current Medication list given to you today.  If you need a refill on your cardiac medications before your next appointment, please call your pharmacy.   Lab work: Your physician recommends that you return for lab work in: Today  If you have labs (blood work) drawn today and your tests are completely normal, you will receive your results only by: Marland Kitchen MyChart Message (if you have MyChart) OR . A paper copy in the mail If you have any lab test that is abnormal or we need to change your treatment, we will call you to review the results.  Testing/Procedures: Your physician discussed the importance of regular exercise and recommended that you start or continue a regular exercise program for good health. Walk daily for 10 minutes a day. You may increase your walking time by 1-2 minutes daily until 30 minutes daily is reached.   Follow-Up: At [facility], you and your health needs are our priority.  As part of our continuing mission to provide you with exceptional heart care, we have created designated Provider Care Teams.  These Care Teams include your primary Cardiologist (physician) and Advanced Practice Providers (APPs -  Physician Assistants and Nurse Practitioners) who all work together to provide you with the care you need, when you need it. You will need a follow up appointment in 1 years.  Please call our office 2 months in advance to schedule this appointment.  You may see Jenkins Rouge, MD or one of the following Advanced Practice Providers on your designated Care Team:   Bernerd Pho, PA-C [facility]) . Ermalinda Barrios, PA-C ([place])  Any Other Special Instructions Will Be Listed Below (If Applicable). Please have your PCP evaluate you for cancer and on your balance.

## 2018-09-30 ENCOUNTER — Telehealth: Payer: Self-pay | Admitting: *Deleted

## 2018-09-30 NOTE — Telephone Encounter (Signed)
Called patient with test results. No answer. Left message to call back.  

## 2018-09-30 NOTE — Telephone Encounter (Signed)
-----   Message from Lonn Georgia, PA-C sent at 09/29/2018  1:11 PM EDT ----- Please let her know that her cholesterol is good, her good cholesterol is a little bit low, activity may help bring that up a little bit. Her bad cholesterol is essentially at goal at 72. Her potassium is decreased at 3.3, unusual for her.  Has she been sick, any GI bugs? She could eat some potassium rich foods such as bananas, oranges or cantaloupe, that will help bring it up. She should follow-up with her PCP for this. Thanks

## 2018-10-01 ENCOUNTER — Encounter (HOSPITAL_COMMUNITY): Payer: Self-pay | Admitting: Psychiatry

## 2018-10-01 ENCOUNTER — Ambulatory Visit (INDEPENDENT_AMBULATORY_CARE_PROVIDER_SITE_OTHER): Payer: Medicare HMO | Admitting: Psychiatry

## 2018-10-01 VITALS — BP 123/73 | HR 69 | Ht <= 58 in | Wt 128.0 lb

## 2018-10-01 DIAGNOSIS — F4310 Post-traumatic stress disorder, unspecified: Secondary | ICD-10-CM | POA: Diagnosis not present

## 2018-10-01 DIAGNOSIS — F419 Anxiety disorder, unspecified: Secondary | ICD-10-CM | POA: Diagnosis not present

## 2018-10-01 MED ORDER — FLUOXETINE HCL 20 MG PO CAPS: 20.0000 mg | ORAL_CAPSULE | Freq: Every day | ORAL | 2 refills | Status: DC

## 2018-10-01 MED ORDER — ALPRAZOLAM 0.5 MG PO TABS: 0.5000 mg | ORAL_TABLET | Freq: Three times a day (TID) | ORAL | 2 refills | Status: DC | PRN

## 2018-10-01 MED ORDER — TRAZODONE HCL 50 MG PO TABS: 50.0000 mg | ORAL_TABLET | Freq: Every day | ORAL | 2 refills | Status: DC
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
## 2018-10-05 ENCOUNTER — Encounter: Payer: Self-pay | Admitting: *Deleted

## 2018-10-14 DIAGNOSIS — M549 Dorsalgia, unspecified: Secondary | ICD-10-CM | POA: Diagnosis not present

## 2018-10-14 DIAGNOSIS — R2689 Other abnormalities of gait and mobility: Secondary | ICD-10-CM | POA: Diagnosis not present

## 2018-10-14 DIAGNOSIS — I10 Essential (primary) hypertension: Secondary | ICD-10-CM | POA: Diagnosis not present

## 2018-10-14 DIAGNOSIS — J439 Emphysema, unspecified: Secondary | ICD-10-CM | POA: Diagnosis not present

## 2018-10-20 DIAGNOSIS — M549 Dorsalgia, unspecified: Secondary | ICD-10-CM | POA: Diagnosis not present

## 2018-10-20 DIAGNOSIS — M545 Low back pain: Secondary | ICD-10-CM | POA: Diagnosis not present

## 2018-10-20 DIAGNOSIS — R2689 Other abnormalities of gait and mobility: Secondary | ICD-10-CM | POA: Diagnosis not present

## 2018-10-27 DIAGNOSIS — M545 Low back pain: Secondary | ICD-10-CM | POA: Diagnosis not present

## 2018-10-27 DIAGNOSIS — R2689 Other abnormalities of gait and mobility: Secondary | ICD-10-CM | POA: Diagnosis not present

## 2018-11-04 DIAGNOSIS — E079 Disorder of thyroid, unspecified: Secondary | ICD-10-CM | POA: Diagnosis not present

## 2018-11-04 DIAGNOSIS — J439 Emphysema, unspecified: Secondary | ICD-10-CM | POA: Diagnosis not present

## 2018-11-04 DIAGNOSIS — I700 Atherosclerosis of aorta: Secondary | ICD-10-CM | POA: Diagnosis not present

## 2018-11-04 DIAGNOSIS — R0781 Pleurodynia: Secondary | ICD-10-CM | POA: Diagnosis not present

## 2018-11-04 DIAGNOSIS — Z9181 History of falling: Secondary | ICD-10-CM | POA: Diagnosis not present

## 2018-11-04 DIAGNOSIS — R296 Repeated falls: Secondary | ICD-10-CM | POA: Diagnosis not present

## 2018-11-12 DIAGNOSIS — J439 Emphysema, unspecified: Secondary | ICD-10-CM | POA: Diagnosis not present

## 2019-01-03 ENCOUNTER — Ambulatory Visit (HOSPITAL_COMMUNITY): Payer: Medicare HMO | Admitting: Psychiatry

## 2019-01-03 ENCOUNTER — Encounter (HOSPITAL_COMMUNITY): Payer: Self-pay | Admitting: Psychiatry

## 2019-01-03 VITALS — BP 134/84 | HR 68 | Ht <= 58 in | Wt 131.2 lb

## 2019-01-03 DIAGNOSIS — F4310 Post-traumatic stress disorder, unspecified: Secondary | ICD-10-CM | POA: Diagnosis not present

## 2019-01-03 MED ORDER — FLUOXETINE HCL 40 MG PO CAPS: 40.0000 mg | ORAL_CAPSULE | Freq: Every day | ORAL | 2 refills | Status: DC

## 2019-01-03 MED ORDER — ALPRAZOLAM 0.5 MG PO TABS: 0.5000 mg | ORAL_TABLET | Freq: Three times a day (TID) | ORAL | 2 refills | Status: DC | PRN

## 2019-01-03 MED ORDER — TRAZODONE HCL 100 MG PO TABS: 100.0000 mg | ORAL_TABLET | Freq: Every day | ORAL | 2 refills | Status: DC
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
## 2019-03-04 ENCOUNTER — Ambulatory Visit (INDEPENDENT_AMBULATORY_CARE_PROVIDER_SITE_OTHER): Payer: Medicare HMO | Admitting: Psychiatry

## 2019-03-04 ENCOUNTER — Other Ambulatory Visit: Payer: Self-pay

## 2019-03-04 ENCOUNTER — Encounter (HOSPITAL_COMMUNITY): Payer: Self-pay | Admitting: Psychiatry

## 2019-03-04 VITALS — BP 136/86 | HR 71 | Ht 60.0 in | Wt 133.0 lb

## 2019-03-04 DIAGNOSIS — F4310 Post-traumatic stress disorder, unspecified: Secondary | ICD-10-CM | POA: Diagnosis not present

## 2019-03-04 DIAGNOSIS — Z638 Other specified problems related to primary support group: Secondary | ICD-10-CM

## 2019-03-04 DIAGNOSIS — Z91410 Personal history of adult physical and sexual abuse: Secondary | ICD-10-CM | POA: Diagnosis not present

## 2019-03-04 DIAGNOSIS — Z62810 Personal history of physical and sexual abuse in childhood: Secondary | ICD-10-CM | POA: Diagnosis not present

## 2019-03-04 DIAGNOSIS — Z79899 Other long term (current) drug therapy: Secondary | ICD-10-CM

## 2019-03-04 MED ORDER — FLUOXETINE HCL 40 MG PO CAPS: 40.0000 mg | ORAL_CAPSULE | Freq: Every day | ORAL | 2 refills | Status: DC

## 2019-03-04 MED ORDER — ALPRAZOLAM 0.5 MG PO TABS: 0.5000 mg | ORAL_TABLET | Freq: Three times a day (TID) | ORAL | 2 refills | Status: DC | PRN

## 2019-03-04 MED ORDER — TRAZODONE HCL 100 MG PO TABS: 100.0000 mg | ORAL_TABLET | Freq: Every day | ORAL | 2 refills | Status: DC

## 2019-03-04 MED ORDER — FLUOXETINE HCL 20 MG PO CAPS: 20.0000 mg | ORAL_CAPSULE | Freq: Every day | ORAL | 2 refills | Status: DC
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
## 2019-08-13 ENCOUNTER — Other Ambulatory Visit (HOSPITAL_COMMUNITY): Payer: Self-pay | Admitting: Psychiatry

## 2019-08-15 NOTE — Telephone Encounter (Signed)
Per provider: Call patient for f/u, not seen since March   * Mail Box is full & can not accept any new messages @ this time

## 2019-08-15 NOTE — Telephone Encounter (Signed)
Call patient for f/u, not seen since March

## 2020-05-31 ENCOUNTER — Other Ambulatory Visit (HOSPITAL_COMMUNITY): Payer: Self-pay | Admitting: Psychiatry

## 2020-06-04 NOTE — Telephone Encounter (Signed)
Call pt for appt 

## 2020-06-04 NOTE — Telephone Encounter (Signed)
LMOM

## 2020-06-25 ENCOUNTER — Other Ambulatory Visit (HOSPITAL_COMMUNITY): Payer: Self-pay | Admitting: Psychiatry

## 2020-06-26 NOTE — Telephone Encounter (Signed)
Needs appt, not seen for months

## 2020-06-27 NOTE — Telephone Encounter (Signed)
UTR and UTLMOM. Per message this number was not able to accept message at this time.

## 2020-07-09 NOTE — Telephone Encounter (Signed)
LMOM

## 2020-07-25 ENCOUNTER — Other Ambulatory Visit (HOSPITAL_COMMUNITY): Payer: Self-pay | Admitting: Psychiatry

## 2020-07-25 NOTE — Telephone Encounter (Signed)
Patient voicemail box is full

## 2020-07-25 NOTE — Telephone Encounter (Signed)
Call for appt

## 2020-08-01 ENCOUNTER — Telehealth (HOSPITAL_COMMUNITY): Payer: Self-pay | Admitting: Psychiatry

## 2020-08-01 NOTE — Telephone Encounter (Signed)
Called the only number on file, unable to leave voicemail due to mailbox being full

## 2020-08-21 ENCOUNTER — Other Ambulatory Visit (HOSPITAL_COMMUNITY): Payer: Self-pay | Admitting: Psychiatry

## 2020-08-21 NOTE — Telephone Encounter (Signed)
Called patient to sch f/u appt for patient and was not able to sch appt and recording stated voicemail box is full.

## 2020-08-21 NOTE — Telephone Encounter (Signed)
Call for appt

## 2020-09-23 ENCOUNTER — Other Ambulatory Visit (HOSPITAL_COMMUNITY): Payer: Self-pay | Admitting: Psychiatry

## 2020-09-24 NOTE — Telephone Encounter (Signed)
Call for appt

## 2020-09-27 ENCOUNTER — Telehealth (HOSPITAL_COMMUNITY): Payer: Medicare HMO | Admitting: Psychiatry

## 2020-09-27 ENCOUNTER — Other Ambulatory Visit: Payer: Self-pay

## 2020-10-10 ENCOUNTER — Telehealth (HOSPITAL_COMMUNITY): Payer: Medicare HMO | Admitting: Psychiatry

## 2020-10-10 ENCOUNTER — Other Ambulatory Visit: Payer: Self-pay

## 2020-10-24 ENCOUNTER — Other Ambulatory Visit (HOSPITAL_COMMUNITY): Payer: Self-pay | Admitting: Psychiatry

## 2020-11-08 ENCOUNTER — Telehealth (HOSPITAL_COMMUNITY): Payer: Self-pay | Admitting: Psychiatry

## 2020-11-08 NOTE — Telephone Encounter (Signed)
Called patient to schedule f/u appt, left vm

## 2020-12-05 ENCOUNTER — Other Ambulatory Visit: Payer: Self-pay

## 2020-12-05 ENCOUNTER — Encounter (HOSPITAL_COMMUNITY): Payer: Medicare HMO | Admitting: Psychiatry

## 2020-12-06 ENCOUNTER — Ambulatory Visit: Payer: Medicare HMO | Admitting: Orthopaedic Surgery

## 2020-12-06 ENCOUNTER — Other Ambulatory Visit: Payer: Self-pay

## 2020-12-20 ENCOUNTER — Other Ambulatory Visit: Payer: Self-pay

## 2020-12-20 ENCOUNTER — Ambulatory Visit: Payer: Medicare HMO | Admitting: Orthopaedic Surgery

## 2021-01-10 ENCOUNTER — Telehealth (HOSPITAL_COMMUNITY): Payer: Medicare HMO | Admitting: Psychiatry

## 2021-01-10 ENCOUNTER — Other Ambulatory Visit: Payer: Self-pay

## 2021-01-10 ENCOUNTER — Telehealth (INDEPENDENT_AMBULATORY_CARE_PROVIDER_SITE_OTHER): Payer: Self-pay | Admitting: Psychiatry

## 2021-01-10 DIAGNOSIS — Z91199 Patient's noncompliance with other medical treatment and regimen due to unspecified reason: Secondary | ICD-10-CM

## 2021-01-10 DIAGNOSIS — Z5329 Procedure and treatment not carried out because of patient's decision for other reasons: Secondary | ICD-10-CM

## 2021-04-08 ENCOUNTER — Other Ambulatory Visit: Payer: Self-pay

## 2021-04-08 ENCOUNTER — Encounter: Payer: Self-pay | Admitting: Internal Medicine

## 2021-04-08 ENCOUNTER — Ambulatory Visit (HOSPITAL_COMMUNITY)
Admission: RE | Admit: 2021-04-08 | Discharge: 2021-04-08 | Disposition: A | Discharge status: Home / Self Care | Payer: Medicare HMO | Source: Ambulatory Visit | Attending: Internal Medicine | Admitting: Internal Medicine

## 2021-04-08 ENCOUNTER — Ambulatory Visit (INDEPENDENT_AMBULATORY_CARE_PROVIDER_SITE_OTHER): Payer: Medicare HMO | Admitting: Internal Medicine

## 2021-04-08 DIAGNOSIS — J449 Chronic obstructive pulmonary disease, unspecified: Secondary | ICD-10-CM | POA: Diagnosis present

## 2021-04-08 DIAGNOSIS — R918 Other nonspecific abnormal finding of lung field: Secondary | ICD-10-CM | POA: Diagnosis not present

## 2021-04-08 DIAGNOSIS — J9611 Chronic respiratory failure with hypoxia: Secondary | ICD-10-CM | POA: Insufficient documentation

## 2021-04-08 MED ORDER — STIOLTO RESPIMAT 2.5-2.5 MCG/ACT IN AERS: 2.0000 | INHALATION_SPRAY | Freq: Every day | RESPIRATORY_TRACT | 0 refills | Status: DC

## 2021-04-08 MED ORDER — PREDNISONE 10 MG PO TABS: ORAL_TABLET | ORAL | 0 refills | Status: DC

## 2021-04-08 NOTE — Assessment & Plan Note (Signed)
See cxr from admit 03/08/21 and corresponding CT chest  >>> repeat CT rec for 06/08/21   ddx = residual post inflammatory changes (organizing pna) vs atypical infection vs lung cancer   Discussed in detail all the  indications, usual  risks and alternatives  relative to the benefits with patient who agrees to proceed with w/u as outlined with baseline labs/ cxr today and return in 2 weeks for recheck    Each maintenance medication was reviewed in detail including emphasizing most importantly the difference between maintenance and prns and under what circumstances the prns are to be triggered using an action plan format where appropriate.  Total time for H and P, chart review, counseling, , directly observing portions of ambulatory 02 saturation study/ and generating customized AVS unique to this office visit / same day charting = 40 min

## 2021-04-08 NOTE — Assessment & Plan Note (Signed)
Placed on 02 at d/c 03/14/21 from [place] -  04/08/2021   Walked RA  approx   500 ft  @ slow pace  stopped due to  Sob with sat dwon to 89% so rec 2lpm hs and prn daytime

## 2021-04-08 NOTE — Progress Notes (Signed)
Kelly Chambers, female    DOB: 04-15-1951  MRN: [id number]   Brief patient profile:  62 yowf quit smoking 03/08/21 s/p admit:  Admit date: 03/06/2021 Discharge date: 03/14/2021  Discharge to: Home with oxygen Discharge Diagnoses:  Acute bronchitis CAD (coronary artery disease) Essential hypertension Tobacco abuse Hypoxemia Pulmonary nodule, right Hypoalbuminemia Resolved Problems: Chest pain Home Health/DME: DME: oxygen  Hospital Course:  Kelly Chambers is a 70 y.o. female that presented to [facility] and was admitted for Acute bronchitis on 03/06/2021  On 3/16 patient presented to the hospital with 1 week history of persistent cough, fever, shortness of breath, chest pain. PO2 was 65 on room air on arrival with white blood count 21.4. Chest x-ray showed subtle nodular features in the right mid chest and right lung base. She is a smoker and has been vaccinated against Covid x3. She was admitted for COPD exacerbation and treated with Rocephin and doxycycline originally along with duo nebs and steroids.  Patient's white blood count remained elevated throughout her hospital stay and actually climbed up to 33.8. Procalcitonin on 3/18 and on 3/23 both negative. Her antibiotics were escalated to doxycycline, cefepime, and vancomycin but she will be sent home on 5 additional days of Levaquin and the white count is thought to be reactive due to the steroids. She will also be sent home with a prednisone taper, nystatin swish and swallow, and nebulizer treatments.  Patient worked with PT and OT and they determined no needs. She was evaluated on the last day of her hospitalization and the recommendations had not changed.  Patient is determined to need home oxygen after oxygen testing on 3/22. She should wear it 24 hours a day and she should follow-up with her primary care provider in 1 to 2 weeks. Lung nodule in the right upper lung seen on initial imaging, recommend  87-month follow-up scan. Patient is anxious to go home and is clinically stable to be discharged today. Discharge plan discussed with the patient and also with Dr. Patrecia Pace who agrees and also saw the patient today. Discharge home.  [name],[name],[name], [name], THEN 0.5 tablets (10 mg total) daily for 5 days. Start taking on: March 14, 2021   CHANGE how you take these medications  FLUoxetine 60 mg Tab Take 60 mg by mouth every morning before breakfast. What changed: Another medication with the same name was removed. Continue taking this medication, [name];[name],000 unit) 1,[name],000 units)) Take 1,[name]         History of Present Illness  04/08/2021  Pulmonary/ 1st office eval/ Kelly Chambers / [place] [facility] / newly on 02/ copd ? Severity/ abn cxr  Chief Complaint  Patient presents with  .  Pulmonary Consult    Referred by Dr. Catalina Antigua for eval of COPD.  She c/o SOB "for years"- recently hospitalized and sent home with o2. She c/o increased cough past few days, prod with white sputum. She also c/o right rib pain.   Dyspnea: baseline 2 aisles at [place] Last time time to MB was over a year prior to OV  But incline down to it and started having trouble getting up the incline so stopped  Cough: somewhat congested in am thick white never bloody Sleep: bed is flat with 2 pillows  SABA use: last used one day prior to OV   02 2lpm hs and 24/7  Fell on R side sev days prior to OV  And R sided cp since   No obvious day to day or daytime variability or assoc excess/ purulent sputum or mucus plugs or hemoptysis or cp or chest tightness, subjective wheeze or overt sinus or hb symptoms.    Also denies any obvious fluctuation of symptoms with weather or environmental changes or other aggravating or alleviating factors except as outlined above   No unusual exposure hx or h/o childhood pna/ asthma or knowledge of premature birth.  Current Allergies, Complete  Past Medical History, Past Surgical History, Family History, and Social History were reviewed in [employer] record.  ROS  The following are not active complaints unless bolded Hoarseness, sore throat, dysphagia, dental problems, itching, sneezing,  nasal congestion or discharge of excess mucus or purulent secretions, ear ache,   fever, chills, sweats, unintended wt loss or wt gain, classically pleuritic or exertional cp,  orthopnea pnd or arm/hand swelling  or leg swelling, presyncope, palpitations, abdominal pain, anorexia, nausea, vomiting, diarrhea  or change in bowel habits or change in bladder habits, change in stools or change in urine, dysuria, hematuria,  rash, arthralgias, visual complaints, headache, numbness, weakness or ataxia or problems with walking or coordination,  change in mood or  memory.            Past Medical History:  Diagnosis Date  . Allergy    hay fever  . Anemia   . Anxiety   . Blood transfusion without reported diagnosis    c section  . Cataract    no surgery  . Clotting disorder ([place])   . COPD (chronic obstructive pulmonary disease) ([place])   . Depression   . Emphysema of lung ([place])   . GERD (gastroesophageal reflux disease)   . Hyperlipidemia   . Hypertension   . Osteoarthritis    back hips shoulders and hands  . Renal failure, chronic, stage 3 (moderate) ([place]) 06/26/2017  . Stroke ([place])   . Thyroid disease     Outpatient Medications Prior to Visit  Medication Sig Dispense Refill  . albuterol (PROVENTIL HFA;VENTOLIN HFA) 108 (90 Base) MCG/ACT inhaler Inhale into the lungs.    Marland Kitchen amLODipine (NORVASC) 5 MG tablet Take 2.5 mg by mouth daily.    Marland Kitchen aspirin 81 MG chewable tablet Chew by mouth.    Marland Kitchen atorvastatin (LIPITOR) 10 MG tablet Take by mouth.    Marland Kitchen FLUoxetine (PROZAC) 20 MG capsule Take 1 capsule (20 mg total) by mouth daily. 30 capsule 2  . levothyroxine (SYNTHROID, LEVOTHROID) 75 MCG tablet Take by mouth.    . nitroGLYCERIN (NITROSTAT) 0.4 MG SL tablet Place under the tongue.    . tiotropium (SPIRIVA) 18 MCG inhalation capsule Place 1 capsule (18 mcg total) into inhaler and inhale as needed. 90 capsule 3  . traZODone (DESYREL) 100 MG tablet TAKE 1 TABLET BY MOUTH AT BEDTIME 30 tablet 2  . ALPRAZolam (XANAX) 0.5 MG tablet TAKE 1 TABLET BY MOUTH THREE TIMES DAILY AS NEEDED FOR ANXIETY 90 tablet 0  . benzonatate (TESSALON) 100 MG capsule Take 1 capsule (100 mg total) by mouth 2 (two) times daily as needed for cough. (Patient not taking: Reported on 03/04/2019) 20 capsule 0  . cholecalciferol (VITAMIN D) 1000 units tablet Take 1,000 Units by mouth daily.    . magic mouthwash SOLN Take 5 mLs by mouth 3 (three) times daily. 150 mL 0  . Multiple Vitamin (THERA) TABS Take by mouth.    . naproxen sodium (ANAPROX) 220 MG tablet Take 220 mg by mouth 2 (two) times daily  with a meal.    . omeprazole (PRILOSEC) 20 MG capsule Take 1 capsule (20 mg total) by mouth daily. 90 capsule 3        0   No facility-administered medications prior to visit.     Objective:     BP 104/70 (BP Location: Left Arm, Cuff Size: Normal)   Pulse 76   Temp 97.7 F (36.5 C) (Temporal)   Ht 5' (1.524 m)  Wt 109 lb (49.4 kg)   SpO2 99% Comment: on 2lpm cont o2  BMI 21.29 kg/m   SpO2: 99 % (on 2lpm cont o2) O2 Type: Continuous O2 O2 Flow Rate (L/min): 2 L/min   slt tremulous chronically ill thin wf nad  HEENT : pt wearing mask not removed for exam due to covid -19 concerns.    NECK :  without JVD/Nodes/TM/ nl carotid upstrokes bilaterally   LUNGS: no acc muscle use,  Mod barrel  contour chest wall with bilateral harsh ins/exp rhonchi  and  without cough on insp or exp maneuvers and mod  Hyperresonant  to  percussion bilaterally     CV:  RRR  no s3 or murmur or increase in P2, and no edema   ABD:  soft and nontender with pos mid insp Hoover's  in the supine position. No bruits or organomegaly appreciated, bowel sounds nl  MS:     ext warm without deformities, calf tenderness, cyanosis or clubbing No obvious joint restrictions   SKIN: warm and dry without lesions    NEURO:  alert, approp, nl sensorium with  no motor or cerebellar deficits apparent.        I personally reviewed images and agree with radiology impression as follows:   Chest CT w/on contrast 03/08/21  1. Patchy areas of ground-glass attenuation with subpleural  reticulation and mild diffuse bronchial wall thickening and  scattered areas of cylindrical bronchiectasis. Imaging features are  nonspecific but likely related to an infectious/inflammatory  process. Sequelae of prior infection/scarring would also be a  consideration. Component of underlying pulmonary fibrosis not  excluded.  2. 15 mm consolidative opacity posterior right costophrenic sulcus  with 7 mm anterior right upper lobe  pulmonary nodule. Follow-up CT  chest in 3 months recommended to assess for resolution.  3. No acute findings in the abdomen or pelvis.  4. Aortic Atherosclerosis (ICD10-I70.0).      Assessment   COPD GOLD ? / still smoking  Quit smoking 02/2021 - 04/08/2021  After extensive coaching inhaler device,  effectiveness =   75% smi > start stiolto 2 pffs each am plus pred 10 mg x 2 daily until better then one daily until return  Labs ordered 04/08/2021  :    alpha one AT phenotype    She is clearly not over her recent aecopd and may have underlying atypical lung infection or even malignancy related to long smoking hx so will do baseline labs / cxr today and return in 2 weeks for recheck    Chronic respiratory failure with hypoxia ([place]) Placed on 02 at d/c 03/14/21 from [place] -  04/08/2021   Walked RA  approx   500 ft  @ slow pace  stopped due to  Sob with sat dwon to 89% so rec 2lpm hs and prn daytime     Abnormal CXR with multiple nodules See cxr from admit 03/08/21 and corresponding CT chest  >>> repeat CT rec for 06/08/21   ddx = residual post inflammatory changes (organizing pna) vs atypical infection vs lung cancer   Discussed in detail all the  indications, usual  risks and alternatives  relative to the benefits with patient who agrees to proceed with w/u as outlined with baseline labs/ cxr today and return in 2 weeks for recheck    Each maintenance medication was reviewed in detail including emphasizing most importantly the difference between maintenance and prns and under what circumstances the prns are to be  triggered using an action plan format where appropriate.  Total time for H and P, chart review, counseling, , directly observing portions of ambulatory 02 saturation study/ and generating customized AVS unique to this office visit / same day charting = 40 min                   Christinia Gully, MD 04/08/2021

## 2021-04-08 NOTE — Patient Instructions (Signed)
Stop spiriva   Plan A = Automatic = Always=    stiolto 2 puff each am   Work on inhaler technique:  relax and gently blow all the way out then take a nice smooth deep breath back in, triggering the inhaler at same time you start breathing in.  Hold for at least  5 seconds if you can  Rinse and gargle with water when done  Prednisone 10  X 2 daily with breakfast until better then 1 daily thereafter   Plan B = Backup (to supplement plan A, not to replace it) Only use your albuterol inhaler as a rescue medication to be used if you can't catch your breath by resting or doing a relaxed purse lip breathing pattern.  - The less you use it, the better it will work when you need it. - Ok to use the inhaler up to 2 puffs  every 4 hours if you must but call for appointment if use goes up over your usual need - Don't leave home without it !!  (think of it like the spare tire for your car)    02 2lpm at bedtime and goal for daytime is to keep it above 90% on meter  congratulations on not smoking   Please remember to go to the lab and x-ray department at [facility]   for your tests - we will call you with the results when they are available.      Please schedule a follow up office visit in 2 weeks, sooner if needed

## 2021-04-08 NOTE — Assessment & Plan Note (Addendum)
Quit smoking 02/2021 - 04/08/2021  After extensive coaching inhaler device,  effectiveness =   75% smi > start stiolto 2 pffs each am plus pred 10 mg x 2 daily until better then one daily until return  Labs ordered 04/08/2021  :    alpha one AT phenotype     She is clearly not over her recent aecopd and may have underlying atypical lung infection or even malignancy related to long smoking hx so will do baseline labs / cxr today and return in 2 weeks for recheck

## 2021-04-11 NOTE — Progress Notes (Signed)
LMTCB

## 2021-04-16 ENCOUNTER — Encounter: Payer: Self-pay | Admitting: Internal Medicine

## 2021-04-16 NOTE — Progress Notes (Signed)
LMTCB and will mail letter

## 2021-05-08 ENCOUNTER — Encounter: Payer: Self-pay | Admitting: Internal Medicine

## 2021-05-08 ENCOUNTER — Other Ambulatory Visit: Payer: Self-pay

## 2021-05-08 ENCOUNTER — Ambulatory Visit: Payer: Medicare HMO | Admitting: Internal Medicine

## 2021-05-08 ENCOUNTER — Telehealth: Payer: Self-pay

## 2021-05-08 DIAGNOSIS — J9611 Chronic respiratory failure with hypoxia: Secondary | ICD-10-CM

## 2021-05-08 DIAGNOSIS — J449 Chronic obstructive pulmonary disease, unspecified: Secondary | ICD-10-CM

## 2021-05-08 MED ORDER — STIOLTO RESPIMAT 2.5-2.5 MCG/ACT IN AERS: INHALATION_SPRAY | RESPIRATORY_TRACT | 11 refills | Status: DC

## 2021-05-08 MED ORDER — ALBUTEROL SULFATE (2.5 MG/3ML) 0.083% IN NEBU: 2.5000 mg | INHALATION_SOLUTION | RESPIRATORY_TRACT | 2 refills | Status: DC | PRN

## 2021-05-08 MED ORDER — AZITHROMYCIN 250 MG PO TABS: ORAL_TABLET | ORAL | 0 refills | Status: DC

## 2021-05-08 NOTE — Telephone Encounter (Addendum)
At time of discharge from today's 05/08/21 office visit with Dr Melvyn Novas, patient stated she wanted the prescribed meds (zithromax and stiolto) to be sent to [facility] instead of [facility] where she originally confirmed for the meds to be sent. Writer changed scripts to be sent to [facility] and called [place] to confirm discontinuation of zithromax and stiolto and [facility] pharmacist stated prescriptions were filled and patient has already picked up the medications already. Writer called Child psychotherapist stated meds have not been filled or picked up and confirmed discontinued zithromax and stiolto script. Nothing further needed at this time.

## 2021-05-08 NOTE — Progress Notes (Addendum)
Kelly Chambers, female    DOB: 04-15-1951  MRN: [id number]   Brief patient profile:  62 yowf quit smoking 03/08/21 s/p admit:  Admit date: 03/06/2021 Discharge date: 03/14/2021  Discharge to: Home with oxygen Discharge Diagnoses:  Acute bronchitis CAD (coronary artery disease) Essential hypertension Tobacco abuse Hypoxemia Pulmonary nodule, right Hypoalbuminemia Resolved Problems: Chest pain Home Health/DME: DME: oxygen  Hospital Course:  Kelly Chambers is a 70 y.o. female that presented to [facility] and was admitted for Acute bronchitis on 03/06/2021  On 3/16 patient presented to the hospital with 1 week history of persistent cough, fever, shortness of breath, chest pain. PO2 was 65 on room air on arrival with white blood count 21.4. Chest x-ray showed subtle nodular features in the right mid chest and right lung base. She is a smoker and has been vaccinated against Covid x3. She was admitted for COPD exacerbation and treated with Rocephin and doxycycline originally along with duo nebs and steroids.  Patient's white blood count remained elevated throughout her hospital stay and actually climbed up to 33.8. Procalcitonin on 3/18 and on 3/23 both negative. Her antibiotics were escalated to doxycycline, cefepime, and vancomycin but she will be sent home on 5 additional days of Levaquin and the white count is thought to be reactive due to the steroids. She will also be sent home with a prednisone taper, nystatin swish and swallow, and nebulizer treatments.  Patient worked with PT and OT and they determined no needs. She was evaluated on the last day of her hospitalization and the recommendations had not changed.  Patient is determined to need home oxygen after oxygen testing on 3/22. She should wear it 24 hours a day and she should follow-up with her primary care provider in 1 to 2 weeks. Lung nodule in the right upper lung seen on initial imaging, recommend  87-month follow-up scan. Patient is anxious to go home and is clinically stable to be discharged today. Discharge plan discussed with the patient and also with Dr. Patrecia Pace who agrees and also saw the patient today. Discharge home.  [name],[name],[name], [name], THEN 0.5 tablets (10 mg total) daily for 5 days. Start taking on: March 14, 2021   CHANGE how you take these medications  FLUoxetine 60 mg Tab Take 60 mg by mouth every morning before breakfast. What changed: Another medication with the same name was removed. Continue taking this medication, [name];[name],000 unit) 1,[name],000 units)) Take 1,[name]         History of Present Illness  04/08/2021  Pulmonary/ 1st office eval/ Kelly Chambers / [place] [facility] / newly on 02/ copd ? Severity/ abn cxr  Chief Complaint  Patient presents with  .  Pulmonary Consult    Referred by Dr. Catalina Antigua for eval of COPD.  She c/o SOB "for years"- recently hospitalized and sent home with o2. She c/o increased cough past few days, prod with white sputum. She also c/o right rib pain.   Dyspnea: baseline 2 aisles at [place] Last time time to MB was over a year prior to OV  But incline down to it and started having trouble getting up the incline so stopped  Cough: somewhat congested in am thick white never bloody Sleep: bed is flat with 2 pillows  SABA use: last used one day prior to OV   02 2lpm hs and 24/7  Fell on R side sev days prior to OV  And R sided cp since  rec Stop spiriva  Plan A = Automatic = Always=    stiolto 2 puff each am  Work on inhaler technique:   Prednisone 10  X 2 daily with breakfast until better then 1 daily thereafter  Plan B = Backup (to supplement plan A, not to replace it) Only use your albuterol inhaler as a rescue medication   02 2lpm at bedtime and goal for daytime is to keep it above 90%  on meter congratulations on not smoking  Please remember to go to the lab and x-ray department at [facility]   for your tests - we will call you with the results when they are available.   05/08/2021  f/u ov/[place] office/Kelly Chambers re: copd  Chief Complaint  Patient presents with  . Follow-up    Productive cough with green phlegm since Wednesday 05/01/21   Dyspnea:  [employer] pushing buggy  Cough: green sputum  Sleeping: congested in am  SABA use: sporadic  02: 02 2lpm hs / not checking daytime  Covid status: vax x 3      No obvious day to day or daytime variability or assoc   mucus plugs or hemoptysis or cp or chest tightness, subjective wheeze or overt sinus or hb symptoms.     Also denies any obvious fluctuation of symptoms with weather or environmental changes or other aggravating or alleviating factors except as outlined above   No unusual exposure hx or h/o childhood pna/ asthma or knowledge of premature  birth.  Current Allergies, Complete Past Medical History, Past Surgical History, Family History, and Social History were reviewed in [employer] record.  ROS  The following are not active complaints unless bolded Hoarseness, sore throat, dysphagia, dental problems, itching, sneezing,  nasal congestion or discharge of excess mucus or purulent secretions, ear ache,   fever, chills, sweats, unintended wt loss or wt gain, classically pleuritic or exertional cp,  orthopnea pnd or arm/hand swelling  or leg swelling, presyncope, palpitations, abdominal pain, anorexia, nausea, vomiting, diarrhea  or change in bowel habits or change in bladder habits, change in stools or change in urine, dysuria, hematuria,  rash, arthralgias, visual complaints, headache, numbness, weakness or ataxia or problems with walking or coordination,  change in mood or  memory.        Current Meds  - - NOTE:   Unable to verify as accurately reflecting what pt takes     Medication Sig  . albuterol (PROVENTIL HFA;VENTOLIN HFA) 108 (90 Base) MCG/ACT inhaler Inhale into the lungs.  Marland Kitchen amLODipine (NORVASC) 5 MG tablet Take 2.5 mg by mouth daily.  Marland Kitchen aspirin 81 MG chewable tablet Chew by mouth.  Marland Kitchen atorvastatin (LIPITOR) 10 MG tablet Take by mouth.  . levothyroxine (SYNTHROID, LEVOTHROID) 75 MCG tablet Take by mouth.  . nitroGLYCERIN (NITROSTAT) 0.4 MG SL tablet Place under the tongue.  . predniSONE (DELTASONE) 10 MG tablet 2 daily until better then one daily  . Tiotropium Bromide-Olodaterol (STIOLTO RESPIMAT) 2.5-2.5 MCG/ACT AERS Inhale 2 puffs into the lungs daily.  . Tiotropium Bromide-Olodaterol (STIOLTO RESPIMAT) 2.5-2.5 MCG/ACT AERS Inhale 2 puffs into the lungs daily.  . traZODone (DESYREL) 100 MG tablet TAKE 1 TABLET BY MOUTH AT BEDTIME                      Past Medical History:  Diagnosis Date  . Allergy    hay fever  . Anemia   . Anxiety   . Blood transfusion without reported diagnosis     c section  . Cataract    no surgery  . Clotting disorder ([place])   . COPD (chronic obstructive pulmonary disease) ([place])   . Depression   . Emphysema of lung ([place])   . GERD (gastroesophageal reflux disease)   . Hyperlipidemia   . Hypertension   . Osteoarthritis    back hips shoulders and hands  . Renal failure, chronic, stage 3 (moderate) ([place]) 06/26/2017  .  Stroke ([place])   . Thyroid disease       Objective:     Wt Readings from Last 3 Encounters:  05/08/21 110 lb 9.6 oz (50.2 kg)  04/08/21 109 lb (49.4 kg)  09/28/18 126 lb 12.8 oz (57.5 kg)      Vital signs reviewed  05/08/2021  - Note at rest 02 sats  96% on RA   General appearance:    Chronically ill amb wf nad     HEENT : pt wearing mask not removed for exam due to covid -19 concerns.    NECK :  without JVD/Nodes/TM/ nl carotid upstrokes bilaterally   LUNGS: no acc muscle use,  Mod barrel  contour chest wall with bilateral  Distant bs s audible wheeze and  without cough on insp or exp maneuvers and mod  Hyperresonant  to  percussion bilaterally     CV:  RRR  no s3 or murmur or increase in P2, and no edema   ABD:  soft and nontender with pos mid insp Hoover's  in the supine position. No bruits or organomegaly appreciated, bowel sounds nl  MS:     ext warm without deformities, calf tenderness, cyanosis or clubbing No obvious joint restrictions   SKIN: warm and dry without lesions    NEURO:  alert, approp, nl sensorium with  no motor or cerebellar deficits apparent.  Positive for resting tremor        Assessment

## 2021-05-08 NOTE — Patient Instructions (Addendum)
Plan A = Automatic = Always=    stiolto 2 puffs each am   zpak  Prednisone 10 mg 2 daily until better then 1 daily   Plan B = Backup (to supplement plan A, not to replace it) Only use your albuterol inhaler as a rescue medication to be used if you can't catch your breath by resting or doing a relaxed purse lip breathing pattern.  - The less you use it, the better it will work when you need it. - Ok to use the inhaler up to 2 puffs  every 4 hours if you must but call for appointment if use goes up over your usual need - Don't leave home without it !!  (think of it like the spare tire for your car)   Plan C = Crisis (instead of Plan B but only if Plan B stops working) - only use your albuterol nebulizer if you first try Plan B and it fails to help > ok to use the nebulizer up to every 4 hours but if start needing it regularly call for immediate appointment   Plan D = Doctor - call me if B and C not adequate       Please schedule a follow up office visit in 6 weeks, call sooner if needed with all medications /inhalers/ solutions in hand so we can verify exactly what you are taking. This includes all medications from all doctors and over the counters food

## 2021-05-09 ENCOUNTER — Encounter: Payer: Self-pay | Admitting: Internal Medicine

## 2021-05-09 MED ORDER — STIOLTO RESPIMAT 2.5-2.5 MCG/ACT IN AERS: INHALATION_SPRAY | RESPIRATORY_TRACT | 11 refills | Status: DC

## 2021-05-09 NOTE — Assessment & Plan Note (Addendum)
Placed on 02 at d/c 03/14/21 from [place] -  04/08/2021   Walked RA  approx   500 ft  @ slow pace  stopped due to  Sob with sat down to 89% so rec 2lpm hs and prn daytime     Advised: Make sure you check your oxygen saturation  at your highest level of activity  to be sure it stays over 90% and adjust  02 flow upward to maintain this level if needed but remember to turn it back to previous settings when you stop (to conserve your supply).          Each maintenance medication was reviewed in detail including emphasizing most importantly the difference between maintenance and prns and under what circumstances the prns are to be triggered using an action plan format where appropriate.  Total time for H and P, chart review, counseling, reviewing SMI (respimat)  device(s) and generating customized AVS unique to this office visit / same day charting = 22 min

## 2021-05-09 NOTE — Assessment & Plan Note (Addendum)
Quit smoking 02/2021 - 04/08/2021  After extensive coaching inhaler device,  effectiveness =   75% smi > start stiolto 2 pffs each am plus pred 10 mg x 2 daily until better then one daily until return  Labs ordered 04/08/2021  :    alpha one AT phenotype  > did not go to lab    Not clear which if any of previous instructions she followed as the numbers on her smi sample don't match up with days since she was given 2 of them and says she already broke one. Also having mild flare of CB  rec  zpak   Re maint rx:   re-do plan from first eval but return  with all meds in hand using a trust but verify approach to confirm accurate Medication  Reconciliation The principal here is that until we are certain that the  patients are doing what we've asked, it makes no sense to ask them to do more.   The goal with a chronic steroid dependent illness is always arriving at the lowest effective dose that controls the disease/symptoms and not accepting a set "formula" which is based on statistics or guidelines that don't always take into account patient  variability or the natural hx of the dz in every individual patient, which may well vary over time.  For now therefore I recommend the patient maintain  Ceiling of 20 mg and floor of 10 mg until returns  Re saba: I spent extra time with pt today reviewing appropriate use of albuterol for prn use on exertion with the following points: 1) saba is for relief of sob that does not improve by walking a slower pace or resting but rather if the pt does not improve after trying this first. 2) If the pt is convinced, as many are, that saba helps recover from activity faster then it's easy to tell if this is the case by re-challenging : ie stop, take the inhaler, then p 5 minutes try the exact same activity (intensity of workload) that just caused the symptoms and see if they are substantially diminished or not after saba 3) if there is an activity that reproducibly causes the  symptoms, try the saba 15 min before the activity on alternate days   If in fact the saba really does help, then fine to continue to use it prn but advised may need to look closer at the maintenance regimen being used to achieve better control of airways disease with exertion.

## 2021-05-15 ENCOUNTER — Telehealth: Payer: Self-pay | Admitting: Internal Medicine

## 2021-05-15 NOTE — Telephone Encounter (Signed)
Received fax from [facility]- pt lost her stiolto inhaler and ins will not cover another until 05/31/21/ I called the pt to see if she wanted some samples to get her through until then. Had to [facility].

## 2021-05-21 NOTE — Telephone Encounter (Signed)
Called and left a detailed msg on machine okay per DPR that we can help her with samples if needed until she can get her Stiolto filled again.

## 2021-06-19 ENCOUNTER — Other Ambulatory Visit: Payer: Self-pay

## 2021-06-19 ENCOUNTER — Ambulatory Visit (INDEPENDENT_AMBULATORY_CARE_PROVIDER_SITE_OTHER): Payer: Medicare Other | Admitting: Internal Medicine

## 2021-06-19 ENCOUNTER — Ambulatory Visit (HOSPITAL_COMMUNITY)
Admission: RE | Admit: 2021-06-19 | Discharge: 2021-06-19 | Disposition: A | Discharge status: Home / Self Care | Payer: Medicare Other | Source: Ambulatory Visit | Attending: Internal Medicine | Admitting: Internal Medicine

## 2021-06-19 ENCOUNTER — Encounter: Payer: Self-pay | Admitting: Internal Medicine

## 2021-06-19 ENCOUNTER — Other Ambulatory Visit (HOSPITAL_COMMUNITY)
Admission: RE | Admit: 2021-06-19 | Discharge: 2021-06-19 | Disposition: A | Discharge status: Home / Self Care | Payer: Medicare Other | Source: Ambulatory Visit | Attending: Internal Medicine | Admitting: Internal Medicine

## 2021-06-19 DIAGNOSIS — E039 Hypothyroidism, unspecified: Secondary | ICD-10-CM

## 2021-06-19 DIAGNOSIS — J449 Chronic obstructive pulmonary disease, unspecified: Secondary | ICD-10-CM | POA: Insufficient documentation

## 2021-06-19 DIAGNOSIS — J9611 Chronic respiratory failure with hypoxia: Secondary | ICD-10-CM

## 2021-06-19 LAB — CBC WITH DIFFERENTIAL/PLATELET
Abs Immature Granulocytes: 0.05 10*3/uL (ref 0.00–0.07)
Basophils Absolute: 0.1 10*3/uL (ref 0.0–0.1)
Basophils Relative: 1 %
Eosinophils Absolute: 0.3 10*3/uL (ref 0.0–0.5)
Eosinophils Relative: 3 %
HCT: 42.7 % (ref 36.0–46.0)
Hemoglobin: 14.4 g/dL (ref 12.0–15.0)
Immature Granulocytes: 1 %
Lymphocytes Relative: 28 %
Lymphs Abs: 2.8 10*3/uL (ref 0.7–4.0)
MCH: 32.4 pg (ref 26.0–34.0)
MCHC: 33.7 g/dL (ref 30.0–36.0)
MCV: 96.2 fL (ref 80.0–100.0)
Monocytes Absolute: 0.9 10*3/uL (ref 0.1–1.0)
Monocytes Relative: 9 %
Neutro Abs: 5.7 10*3/uL (ref 1.7–7.7)
Neutrophils Relative %: 58 %
Platelets: 359 10*3/uL (ref 150–400)
RBC: 4.44 MIL/uL (ref 3.87–5.11)
RDW: 12.2 % (ref 11.5–15.5)
WBC: 9.8 10*3/uL (ref 4.0–10.5)
nRBC: 0 % (ref 0.0–0.2)

## 2021-06-19 LAB — BASIC METABOLIC PANEL
Anion gap: 8 (ref 5–15)
BUN: 16 mg/dL (ref 8–23)
CO2: 24 mmol/L (ref 22–32)
Calcium: 8.9 mg/dL (ref 8.9–10.3)
Chloride: 106 mmol/L (ref 98–111)
Creatinine, Ser: 1.12 mg/dL — ABNORMAL HIGH (ref 0.44–1.00)
GFR, Estimated: 54 mL/min — ABNORMAL LOW (ref >60)
Glucose, Bld: 116 mg/dL — ABNORMAL HIGH (ref 70–99)
Potassium: 4.3 mmol/L (ref 3.5–5.1)
Sodium: 138 mmol/L (ref 135–145)

## 2021-06-19 LAB — TSH: TSH: 0.282 u[IU]/mL — ABNORMAL LOW (ref 0.350–4.500)

## 2021-06-19 LAB — D-DIMER, QUANTITATIVE: D-Dimer, Quant: 0.29 ug/mL-FEU (ref 0.00–0.50)

## 2021-06-19 LAB — SEDIMENTATION RATE: Sed Rate: 15 mm/hr (ref 0–22)

## 2021-06-19 MED ORDER — AZITHROMYCIN 250 MG PO TABS: ORAL_TABLET | ORAL | 0 refills | Status: DC

## 2021-06-19 MED ORDER — STIOLTO RESPIMAT 2.5-2.5 MCG/ACT IN AERS: 2.0000 | INHALATION_SPRAY | Freq: Every day | RESPIRATORY_TRACT | 0 refills | Status: DC

## 2021-06-19 MED ORDER — STIOLTO RESPIMAT 2.5-2.5 MCG/ACT IN AERS: 2.0000 | INHALATION_SPRAY | Freq: Every day | RESPIRATORY_TRACT | 11 refills | Status: DC

## 2021-06-19 NOTE — Patient Instructions (Addendum)
Make sure you check your oxygen saturation  at your highest level of activity  to be sure it stays over 90% and adjust  02 flow upward to maintain this level if needed but remember to turn it back to previous settings when you stop (to conserve your supply).   Zpak   Change spiriva to stiolto 2 puffs each am     Please remember to go to the lab and x-ray department at [facility]   for your tests - we will call you with the results when they are available.      Please schedule a follow up office visit in 6 weeks, call sooner if needed with all medications /inhalers/ solutions in hand so we can verify exactly what you are taking. This includes all medications from all doctors and over the counters

## 2021-06-19 NOTE — Progress Notes (Signed)
Kelly Chambers, female    DOB: 04-15-1951  MRN: [id number]   Brief patient profile:  62 yowf quit smoking 03/08/21 s/p admit:  Admit date: 03/06/2021 Discharge date: 03/14/2021  Discharge to: Home with oxygen Discharge Diagnoses:  Acute bronchitis CAD (coronary artery disease) Essential hypertension Tobacco abuse Hypoxemia Pulmonary nodule, right Hypoalbuminemia Resolved Problems: Chest pain Home Health/DME: DME: oxygen  Hospital Course:  Kelly Chambers is a 70 y.o. female that presented to [facility] and was admitted for Acute bronchitis on 03/06/2021  On 3/16 patient presented to the hospital with 1 week history of persistent cough, fever, shortness of breath, chest pain. PO2 was 65 on room air on arrival with white blood count 21.4. Chest x-ray showed subtle nodular features in the right mid chest and right lung base. She is a smoker and has been vaccinated against Covid x3. She was admitted for COPD exacerbation and treated with Rocephin and doxycycline originally along with duo nebs and steroids.  Patient's white blood count remained elevated throughout her hospital stay and actually climbed up to 33.8. Procalcitonin on 3/18 and on 3/23 both negative. Her antibiotics were escalated to doxycycline, cefepime, and vancomycin but she will be sent home on 5 additional days of Levaquin and the white count is thought to be reactive due to the steroids. She will also be sent home with a prednisone taper, nystatin swish and swallow, and nebulizer treatments.  Patient worked with PT and OT and they determined no needs. She was evaluated on the last day of her hospitalization and the recommendations had not changed.  Patient is determined to need home oxygen after oxygen testing on 3/22. She should wear it 24 hours a day and she should follow-up with her primary care provider in 1 to 2 weeks. Lung nodule in the right upper lung seen on initial imaging, recommend  87-month follow-up scan. Patient is anxious to go home and is clinically stable to be discharged today. Discharge plan discussed with the patient and also with Dr. Patrecia Pace who agrees and also saw the patient today. Discharge home.  [name],[name],[name], [name], THEN 0.5 tablets (10 mg total) daily for 5 days. Start taking on: March 14, 2021   CHANGE how you take these medications  FLUoxetine 60 mg Tab Take 60 mg by mouth every morning before breakfast. What changed: Another medication with the same name was removed. Continue taking this medication, [name];[name],000 unit) 1,[name],000 units)) Take 1,[name]         History of Present Illness  04/08/2021  Pulmonary/ 1st office eval/ Kelly Chambers /  Office / newly on 02/ copd ? Severity/ abn cxr  Chief Complaint  Patient presents with    Pulmonary Consult    Referred by Dr. Catalina Antigua for eval of COPD.  She c/o SOB "for years"- recently hospitalized and sent home with o2. She c/o increased cough past few days, prod with white sputum. She also c/o right rib pain.   Dyspnea: baseline 2 aisles at [place] Last time time to MB was over a year prior to OV  But incline down to it and started having trouble getting up the incline so stopped  Cough: somewhat congested in am thick white never bloody Sleep: bed is flat with 2 pillows  SABA use: last used one day prior to OV   02 2lpm hs and 24/7  Fell on R side sev days prior to OV  And R sided cp since  rec Stop spiriva  Plan A = Automatic = Always=    stiolto 2 puff each am  Work on inhaler technique:   Prednisone 10  X 2 daily with breakfast until better then 1 daily thereafter  Plan B = Backup (to supplement plan A, not to replace it) Only use your albuterol inhaler as a rescue medication   02 2lpm at bedtime and goal for daytime is to keep it above 90%  on meter congratulations on not smoking  Please remember to go to the lab and x-ray department at [facility]   for your tests - we will call you with the results when they are available.   05/08/2021  f/u ov/[place] office/Kelly Chambers re: copd  Chief Complaint  Patient presents with   Follow-up    Productive cough with green phlegm since Wednesday 05/01/21   Dyspnea:  [employer] pushing buggy  Cough: green sputum  Sleeping: congested in am  SABA use: sporadic  02: 02 2lpm hs / not checking daytime  Covid status: vax x 3   Rec Plan A = Automatic = Always=    stiolto 2 puffs each am  zpak Prednisone 10 mg 2 daily until better then 1 daily  Plan B = Backup (to supplement plan A, not to replace it) Only use your albuterol inhaler as a rescue medication  Plan C = Crisis (instead of Plan B but only if Plan B stops working) - only use your albuterol nebulizer if you first try Plan B and it fails to help > ok to use the  nebulizer up to every 4 hours but if start needing it regularly call for immediate appointment Plan D = Doctor - call me if B and C not adequate Please schedule a follow up office visit in 6 weeks, call sooner if needed with all medications /inhalers/ solutions in hand   Labs not done from 04/08/21    06/19/2021  f/u ov/[place] office/Kelly Chambers re:  copd with hypoxemic RF / not smoking on spiriva/prednisone saba  Chief Complaint  Patient presents with   Follow-up    Pt c/o increased SOB since had a fall and hit her ribs on the left approx 4 days ago. She feels like she can not take in a deep enough breath. She is also c/o cough with green sputum for the fast few days.     Dyspnea:  still able to do [employer] shopping sometimes on 02  Cough: worse x 10 days green mucus  Sleeping: am cough some am's  SABA use: twice daily saba  02: 2lpm  Covid status: vax x 3  L cp p fell 4 d prior to [place]  hurts ever since to cough      No obvious day to day or daytime variability or assoc  or mucus plugs or hemoptysis   or chest tightness, subjective wheeze or overt sinus or hb symptoms.    Also denies any obvious fluctuation of symptoms with weather or environmental changes or other aggravating or alleviating factors except as outlined above   No unusual exposure hx or h/o childhood pna/ asthma or knowledge of premature birth.  Current Allergies, Complete Past Medical History, Past Surgical History, Family History, and Social History were reviewed in [employer] record.  ROS  The following are not active complaints unless bolded Hoarseness, sore throat, dysphagia, dental problems, itching, sneezing,  nasal congestion or discharge of excess mucus or purulent secretions, ear ache,   fever, chills, sweats, unintended wt loss or wt gain, classically  exertional cp,  orthopnea pnd or arm/hand swelling  or leg swelling, presyncope, palpitations, abdominal pain, anorexia, nausea, vomiting,  diarrhea  or change in bowel habits or change in bladder habits, change in stools or change in urine, dysuria, hematuria,  rash, arthralgias, visual complaints, headache, numbness, weakness or ataxia or problems with walking or coordination,  change in mood or  memory.  Current Meds  Medication Sig   albuterol (PROVENTIL HFA;VENTOLIN HFA) 108 (90 Base) MCG/ACT inhaler Inhale into the lungs.   albuterol (PROVENTIL) (2.5 MG/3ML) 0.083% nebulizer solution Take 3 mLs (2.5 mg total) by nebulization every 4 (four) hours as needed for wheezing or shortness of breath.   amLODipine (NORVASC) 5 MG tablet Take 2.5 mg by mouth daily.   aspirin 81 MG chewable tablet Chew by mouth.   atorvastatin (LIPITOR) 10 MG tablet Take by mouth.   azithromycin (ZITHROMAX) 250 MG tablet 2 on day one and 1 dailyx x 4 days   FLUoxetine (PROZAC) 20 MG capsule Take 1 capsule (20 mg total) by mouth daily.   levothyroxine (SYNTHROID, LEVOTHROID) 75 MCG tablet Take by mouth.   nitroGLYCERIN (NITROSTAT) 0.4 MG SL tablet Place under the tongue.   Tiotropium Bromide-Olodaterol (STIOLTO RESPIMAT) 2.5-2.5 MCG/ACT AERS Inhale 2 puffs into the lungs daily.   Tiotropium Bromide-Olodaterol (STIOLTO RESPIMAT) 2.5-2.5 MCG/ACT AERS Inhale 2 puffs into the lungs daily.   traZODone (DESYREL) 100 MG tablet TAKE 1 TABLET BY MOUTH AT BEDTIME   [DISCONTINUED] tiotropium (SPIRIVA) 18 MCG inhalation capsule Place 18 mcg into inhaler and inhale daily.                      Past Medical History:  Diagnosis Date   Allergy    hay fever   Anemia    Anxiety    Blood transfusion without reported diagnosis    c section   Cataract    no surgery   Clotting disorder (HCC)    COPD (chronic obstructive pulmonary disease) (HCC)    Depression    Emphysema of lung (HCC)    GERD (gastroesophageal reflux disease)    Hyperlipidemia    Hypertension    Osteoarthritis    back hips shoulders and hands   Renal failure, chronic, stage 3  (moderate) ([place]) 06/26/2017   Stroke ([place])    Thyroid disease       Objective:      06/19/2021       109  05/08/21 110 lb 9.6 oz (50.2 kg)  04/08/21 109 lb (49.4 kg)  09/28/18 126 lb 12.8 oz (57.5 kg)    Vital signs reviewed  06/19/2021  - Note at rest 02 sats  91% on RA   General appearance:    chronically ill amb wf nad   HEENT : pt wearing mask not removed for exam due to covid -19 concerns.    NECK :  without JVD/Nodes/TM/ nl carotid upstrokes bilaterally   LUNGS: no acc muscle use,  Mod barrel  contour chest wall with bilateral  Distant insp/exp rhonchi  and  without cough on insp or exp maneuvers and mod  Hyperresonant  to  percussion bilaterally     CV:  RRR  no s3 or murmur or increase in P2, and no edema   ABD:  soft and nontender with pos mid insp Hoover's  in the supine position. No bruits or organomegaly appreciated, bowel sounds nl  MS:     ext warm without deformities, calf tenderness, cyanosis or clubbing No obvious joint restrictions   SKIN: warm and dry without lesions    NEURO:  alert, approp, nl sensorium with  no motor or cerebellar deficits apparent.  Positive for resting tremor   CXR PA and Lateral:   06/19/2021 :    I personally reviewed images / impression as follows:    Chronic changes, no obvious as dz/ effusion/  ptx or rib fx on L    Labs ordered/ reviewed:      Chemistry      Component Value Date/Time   NA 138 06/19/2021 1537   K 4.3 06/19/2021 1537   CL 106 06/19/2021 1537   CO2 24 06/19/2021 1537   BUN 16 06/19/2021 1537   CREATININE 1.12 (H) 06/19/2021 1537   CREATININE 1.20 (H) 06/25/2017 1502      Component Value Date/Time   CALCIUM 8.9 06/19/2021 1537   ALKPHOS 66 09/28/2018 1607   AST 23 09/28/2018 1607   ALT 15 09/28/2018 1607   BILITOT 0.8 09/28/2018 1607        Lab Results  Component Value Date   WBC 9.8 06/19/2021   HGB 14.4 06/19/2021   HCT 42.7 06/19/2021   MCV 96.2 06/19/2021   PLT 359 06/19/2021     Lab  Results  Component Value Date   DDIMER 0.29 06/19/2021      Lab Results  Component Value Date   TSH 0.282 (L) 06/19/2021     No results found for: PROBNP     Lab Results  Component Value Date   ESRSEDRATE 15 06/19/2021        Labs ordered 06/19/2021  :      alpha one AT phenotype          Assessment

## 2021-06-20 ENCOUNTER — Encounter: Payer: Self-pay | Admitting: Internal Medicine

## 2021-06-20 ENCOUNTER — Encounter: Payer: Self-pay | Admitting: *Deleted

## 2021-06-20 DIAGNOSIS — E039 Hypothyroidism, unspecified: Secondary | ICD-10-CM | POA: Insufficient documentation

## 2021-06-20 NOTE — Assessment & Plan Note (Signed)
Lab Results  Component Value Date   TSH 0.282 (L) 06/19/2021    rx adequate, may need long term adjustment down but defer to PCP

## 2021-06-20 NOTE — Assessment & Plan Note (Addendum)
Quit smoking 02/2021 - 04/08/2021  After extensive coaching inhaler device,  effectiveness =   75% smi > start stiolto 2 pffs each am plus pred 10 mg x 2 daily until better then one daily until return  .Labs ordered 04/08/2021  :    alpha one AT phenotype  > did not go to lab  - 06/19/2021  After extensive coaching inhaler device,  effectiveness =    75% with SMI changed to stiolto 2 each am Labs ordered 06/19/2021  :     alpha one AT phenotype    Pt is Group B in terms of symptom/risk and laba/lama therefore appropriate rx at this point >>>  Try stioloto and zpak for acute bronchitis but no need for steroids at this point  Re saba: I spent extra time with pt today reviewing appropriate use of albuterol for prn use on exertion with the following points: 1) saba is for relief of sob that does not improve by walking a slower pace or resting but rather if the pt does not improve after trying this first. 2) If the pt is convinced, as many are, that saba helps recover from activity faster then it's easy to tell if this is the case by re-challenging : ie stop, take the inhaler, then p 5 minutes try the exact same activity (intensity of workload) that just caused the symptoms and see if they are substantially diminished or not after saba 3) if there is an activity that reproducibly causes the symptoms, try the saba 15 min before the activity on alternate days   If in fact the saba really does help, then fine to continue to use it prn but advised may need to look closer at the maintenance regimen being used to achieve better control of airways disease with exertion.

## 2021-06-20 NOTE — Assessment & Plan Note (Signed)
Placed on 02 at d/c 03/14/21 from [place] -  04/08/2021   Walked RA  approx   500 ft  @ slow pace  stopped due to  Sob with sat down to 89% so rec 2lpm hs and prn daytime     Adequate control on present rx, reviewed in detail with pt > no change in rx needed           Each maintenance medication was reviewed in detail including emphasizing most importantly the difference between maintenance and prns and under what circumstances the prns are to be triggered using an action plan format where appropriate.  Total time for H and P, chart review, counseling, reviewing smi/hfa device(s) and generating customized AVS unique to this office visit / same day charting = 70min

## 2021-07-22 ENCOUNTER — Telehealth (HOSPITAL_COMMUNITY): Payer: Self-pay | Admitting: *Deleted

## 2021-07-22 NOTE — Telephone Encounter (Signed)
Referral from [place] at [facility] received. Unable to reach patient. Called x2 with number provided on ref and in chart. LMOM with office number.

## 2021-07-31 ENCOUNTER — Ambulatory Visit: Payer: Medicare Other | Admitting: Internal Medicine

## 2021-07-31 NOTE — Progress Notes (Deleted)
Kelly Chambers, female    DOB: [birth year]  MRN: [id number]   Brief patient profile:  59 yowf quit smoking 03/08/21 s/p admit:  Admit date: 03/06/2021 Discharge date: 03/14/2021  Discharge to: Home with oxygen Discharge Diagnoses:  Acute bronchitis CAD (coronary artery disease) Essential hypertension Tobacco abuse Hypoxemia Pulmonary nodule, right Hypoalbuminemia Resolved Problems: Chest pain Home Health/DME: DME: oxygen  Hospital Course:  Kelly Chambers is a 70 y.o. female that presented to [facility] and was admitted for Acute bronchitis on 03/06/2021  On 3/16 patient presented to the hospital with 1 week history of persistent cough, fever, shortness of breath, chest pain. PO2 was 65 on room air on arrival with white blood count 21.4. Chest x-ray showed subtle nodular features in the right mid chest and right lung base. She is a smoker and has been vaccinated against Covid x3. She was admitted for COPD exacerbation and treated with Rocephin and doxycycline originally along with duo nebs and steroids.  Patient's white blood count remained elevated throughout her hospital stay and actually climbed up to 33.8. Procalcitonin on 3/18 and on 3/23 both negative. Her antibiotics were escalated to doxycycline, cefepime, and vancomycin but she will be sent home on 5 additional days of Levaquin and the white count is thought to be reactive due to the steroids. She will also be sent home with a prednisone taper, nystatin swish and swallow, and nebulizer treatments.  Patient worked with PT and OT and they determined no needs. She was evaluated on the last day of her hospitalization and the recommendations had not changed.  Patient is determined to need home oxygen after oxygen testing on 3/22. She should wear it 24 hours a day and she should follow-up with her primary care provider in 1 to 2 weeks. Lung nodule in the right upper lung seen on initial imaging, recommend  60-monthfollow-up scan. Patient is anxious to go home and is clinically stable to be discharged today. Discharge plan discussed with the patient and also with Dr. VPatrecia Pacewho agrees and also saw the patient today. Discharge home.  [name],[name],[name], [name], THEN 0.5 tablets (10 mg total) daily for 5 days. Start taking on: March 14, 2021   CHANGE how you take these medications  FLUoxetine 60 mg Tab Take 60 mg by mouth every morning before breakfast. What changed: Another medication with the same name was removed. Continue taking this medication, [name];[name],000 unit) 1,[name],000 units)) Take 1,[name]         History of Present Illness  04/08/2021  Pulmonary/ 1st office eval/ Kelly Chambers / [place] [facility] / newly on 02/ copd ? Severity/ abn cxr  Chief Complaint  Patient presents with    Pulmonary Consult    Referred by Dr. Catalina Chambers for eval of COPD.  She c/o SOB "for years"- recently hospitalized and sent home with o2. She c/o increased cough past few days, prod with white sputum. She also c/o right rib pain.   Dyspnea: baseline 2 aisles at [place] Last time time to MB was over a year prior to OV  But incline down to it and started having trouble getting up the incline so stopped  Cough: somewhat congested in am thick white never bloody Sleep: bed is flat with 2 pillows  SABA use: last used one day prior to OV   02 2lpm hs and 24/7  Fell on R side sev days prior to OV  And R sided cp since  rec Stop spiriva  Plan A = Automatic = Always=    stiolto 2 puff each am  Work on inhaler technique:   Prednisone 10  X 2 daily with breakfast until better then 1 daily thereafter  Plan B = Backup (to supplement plan A, not to replace it) Only use your albuterol inhaler as a rescue medication   02 2lpm at bedtime and goal for daytime is to keep it above 90%  on meter congratulations on not smoking  Please remember to go to the lab and x-ray department at [facility]   for your tests - we will call you with the results when they are available.   05/08/2021  f/u ov/[place] office/Ric Rosenberg re: copd  Chief Complaint  Patient presents with   Follow-up    Productive cough with green phlegm since Wednesday 05/01/21   Dyspnea:  [employer] pushing buggy  Cough: green sputum  Sleeping: congested in am  SABA use: sporadic  02: 02 2lpm hs / not checking daytime  Covid status: vax x 3   Rec Plan A = Automatic = Always=    stiolto 2 puffs each am  zpak Prednisone 10 mg 2 daily until better then 1 daily  Plan B = Backup (to supplement plan A, not to replace it) Only use your albuterol inhaler as a rescue medication  Plan C = Crisis (instead of Plan B but only if Plan B stops working) - only use your albuterol nebulizer if you first try Plan B and it fails to help > ok to use the  nebulizer up to every 4 hours but if start needing it regularly call for immediate appointment Plan D = Doctor - call me if B and C not adequate Please schedule a follow up office visit in 6 weeks, call sooner if needed with all medications /inhalers/ solutions in hand   Labs not done from 04/08/21    06/19/2021  f/u ov/[place] office/Jorgia Manthei re:  copd with hypoxemic RF / not smoking on spiriva/prednisone saba  Chief Complaint  Patient presents with   Follow-up    Pt c/o increased SOB since had a fall and hit her ribs on the left approx 4 days ago. She feels like she can not take in a deep enough breath. She is also c/o cough with green sputum for the fast few days.    Dyspnea:  still able to do [employer] shopping sometimes on 02  Cough: worse x 10 days green mucus  Sleeping: am cough some am's  SABA use: twice daily saba  02: 2lpm  Covid status: vax x 3  L cp p fell 4 d prior to [place]  hurts ever since to cough Rec Make sure you check your oxygen saturation  at your highest level of activity  to be sure it stays over 90% and adjust  02 flow upward to maintain this level if needed but remember to turn it back to previous settings when you stop (to conserve your supply).  Zpak  Change spiriva to stiolto 2 puffs each am  Please remember to go to the lab and x-ray department at [facility]   for your tests - we will call you with the results when they are available. Please schedule a follow up office visit in 6 weeks, call sooner if needed with all medications /inhalers/ solutions in hand    07/31/2021  f/u ov/[place] office/Susi Goslin re: COPD ? / still smoking  NEEDS alpha one pheotype  No chief complaint on file.   Dyspnea:  *** Cough: *** Sleeping: *** SABA use: *** 02: *** Covid status: *** Lung cancer screening: ***   No obvious day to day or daytime variability or assoc excess/ purulent sputum or mucus plugs or hemoptysis or cp or chest tightness, subjective wheeze or overt  sinus or hb symptoms.   *** without nocturnal  or early am exacerbation  of respiratory  c/o's or need for noct  saba. Also denies any obvious fluctuation of symptoms with weather or environmental changes or other aggravating or alleviating factors except as outlined above   No unusual exposure hx or h/o childhood pna/ asthma or knowledge of premature birth.  Current Allergies, Complete Past Medical History, Past Surgical History, Family History, and Social History were reviewed in [employer] record.  ROS  The following are not active complaints unless bolded Hoarseness, sore throat, dysphagia, dental problems, itching, sneezing,  nasal congestion or discharge of excess mucus or purulent secretions, ear ache,   fever, chills, sweats, unintended wt loss or wt gain, classically pleuritic or exertional cp,  orthopnea pnd or arm/hand swelling  or leg swelling, presyncope, palpitations, abdominal pain, anorexia, nausea, vomiting, diarrhea  or change in bowel habits or change in bladder habits, change in stools or change in urine, dysuria, hematuria,  rash, arthralgias, visual complaints, headache, numbness, weakness or ataxia or problems with walking or coordination,  change in mood or  memory.        No outpatient medications have been marked as taking for the 07/31/21 encounter (Appointment) with Tanda Rockers, MD.                          Past Medical History:  Diagnosis Date   Allergy    hay fever   Anemia    Anxiety    Blood transfusion without reported diagnosis    c section   Cataract    no surgery   Clotting disorder (HCC)    COPD (chronic obstructive pulmonary disease) (HCC)    Depression    Emphysema of lung (HCC)    GERD (gastroesophageal reflux disease)    Hyperlipidemia    Hypertension    Osteoarthritis    back hips shoulders and hands   Renal failure, chronic, stage 3 (moderate) ([place]) 06/26/2017   Stroke ([place])    Thyroid disease        Objective:    07/31/2021         ***   06/19/2021       109  05/08/21 110 lb 9.6 oz (50.2 kg)  04/08/21 109 lb (49.4 kg)  09/28/18 126 lb 12.8 oz (57.5 kg)     Vital signs reviewed  07/31/2021  - Note at rest 02 sats  ***% on ***   General appearance:    ***    Mod bar  Positive for resting tremor***             Lab Results  Component Value Date   TSH 0.282 (L) 06/19/2021            Assessment

## 2021-08-22 DEATH — deceased

## 2021-08-28 NOTE — Telephone Encounter (Signed)
No Show  called for telemed visit, mailbox full

## 2021-09-17 NOTE — Progress Notes (Signed)
This encounter was created in error - please disregard.

## 2022-09-25 ENCOUNTER — Encounter: Payer: Self-pay | Admitting: *Deleted

## 2022-09-25 ENCOUNTER — Telehealth: Payer: Self-pay | Admitting: Diagnostic Neuroimaging

## 2022-09-25 ENCOUNTER — Ambulatory Visit: Payer: Medicare HMO | Admitting: Diagnostic Neuroimaging

## 2022-09-25 VITALS — BP 116/69 | HR 68 | Ht <= 58 in | Wt 107.8 lb

## 2022-09-25 DIAGNOSIS — G309 Alzheimer's disease, unspecified: Secondary | ICD-10-CM

## 2022-09-25 DIAGNOSIS — R413 Other amnesia: Secondary | ICD-10-CM

## 2022-09-25 NOTE — Telephone Encounter (Signed)
[employer] medicare NPR sent to [facility] [phone] 

## 2022-09-25 NOTE — Patient Instructions (Signed)
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

## 2022-09-25 NOTE — Progress Notes (Signed)
[facility]  PATIENT: Kelly Chambers DOB: 1951-01-01  REFERRING CLINICIAN: Leeanne Rio, MD  HISTORY FROM: patient REASON FOR VISIT: new consult    HISTORICAL  CHIEF COMPLAINT:  Chief Complaint  Patient presents with   Memory Loss    Rm 6, est patient with new issue, alone  MMSE 17 "I want to know if I have dementia"     HISTORY OF PRESENT ILLNESS:     UPDATE (09/25/22, VRP): 71 year old female here for evaluation memory loss.  Patient reports mild short-term memory loss, disorientation, confusion, trouble with driving directions for past 1 to 2 years.  Able to take care of personal affairs such as hygiene, bathing, feeding, household chores.  Having more trouble with medications, finances and driving.  Lives with husband and other family members at home.  She has 6 dogs at home (chihuahua's).   PRIOR HPI ([id number]):71 year old female here for evaluation of right-sided headaches.  Patient reports intermittent balance and gait difficulty over the past 1-2 years. She has hit her head several times. She did not seek medical attention for these falls. Patient is having almost 3 headaches per week, with right-sided bumping and throbbing sensation, phonophobia, nausea. She takes a few over-the-counter aspirin without relief. Patient had history of migraine headaches at age 27 years old but does not remember details about these headaches.  Patient under significant stress at home. She has poor sleep. She eats once per day and has poor appetite. She has depression and anxiety. She is seeing psychiatry and is on Prozac and Xanax. She recently started trazodone to help with sleep.  Patient also has history of right-sided trigeminal neuralgia with symptoms starting in 2013. She was previously evaluated by another neurologist Dr. Merlene Laughter, who referred patient to [facility] neurosurgery Dr. Salomon Fick, who performed microvascular decompression on the right side in  2016.  Patient also has other symptoms including diffuse joint aches and pains, lightheadedness and dizziness, memory loss, fatigue, blurred vision, ringing in ears.    REVIEW OF SYSTEMS: Full 14 system review of systems performed and negative with exception of: As per history of present illness.   ALLERGIES: Allergies  Allergen Reactions   Penicillins Itching    Skin itching    HOME MEDICATIONS: Outpatient Medications Prior to Visit  Medication Sig Dispense Refill   albuterol (PROVENTIL HFA;VENTOLIN HFA) 108 (90 Base) MCG/ACT inhaler Inhale into the lungs.     albuterol (PROVENTIL) (2.5 MG/3ML) 0.083% nebulizer solution Take 3 mLs (2.5 mg total) by nebulization every 4 (four) hours as needed for wheezing or shortness of breath. 75 mL 2   amLODipine (NORVASC) 2.5 MG tablet Take 1 tablet by mouth daily.     aspirin 81 MG chewable tablet Chew by mouth.     atorvastatin (LIPITOR) 20 MG tablet Take 20 mg by mouth daily.     gabapentin (NEURONTIN) 300 MG capsule Take 300 mg by mouth 3 (three) times daily.     levothyroxine (SYNTHROID, LEVOTHROID) 75 MCG tablet Take by mouth.     nitroGLYCERIN (NITROSTAT) 0.4 MG SL tablet Place under the tongue.     predniSONE (DELTASONE) 10 MG tablet 2 daily until better then one daily 100 tablet 0   Tiotropium Bromide-Olodaterol (STIOLTO RESPIMAT) 2.5-2.5 MCG/ACT AERS Inhale 2 puffs into the lungs daily. 4 g 0   traZODone (DESYREL) 100 MG tablet TAKE 1 TABLET BY MOUTH AT BEDTIME 30 tablet 2   amLODipine (NORVASC) 5 MG tablet Take 2.5 mg by mouth daily.  atorvastatin (LIPITOR) 10 MG tablet Take by mouth.     azithromycin (ZITHROMAX) 250 MG tablet 2 on day one and 1 dailyx x 4 days 6 tablet 0   FLUoxetine (PROZAC) 20 MG capsule Take 1 capsule (20 mg total) by mouth daily. 30 capsule 2   Tiotropium Bromide-Olodaterol (STIOLTO RESPIMAT) 2.5-2.5 MCG/ACT AERS Inhale 2 puffs into the lungs daily. 1 each 11   No facility-administered medications prior to  visit.    PAST MEDICAL HISTORY: Past Medical History:  Diagnosis Date   Allergy    hay fever   Anemia    Anxiety    Blood transfusion without reported diagnosis    c section   Cataract    no surgery   Clotting disorder (HCC)    COPD (chronic obstructive pulmonary disease) (HCC)    Depression    Emphysema of lung (HCC)    GERD (gastroesophageal reflux disease)    Hyperlipidemia    Hypertension    Orthostatic hypotension    Osteoarthritis    back hips shoulders and hands   Ovarian cancer (HCC)    Renal failure, chronic, stage 3 (moderate) ([place]) 06/26/2017   Stroke (HCC)    Syncope and collapse    Thyroid disease     PAST SURGICAL HISTORY: Past Surgical History:  Procedure Laterality Date   ABDOMINAL HYSTERECTOMY     uncertain   CARDIAC CATHETERIZATION     1 stent   CESAREAN SECTION     x 3   CHOLECYSTECTOMY     CORONARY ANGIOPLASTY WITH STENT PLACEMENT     OOPHORECTOMY     TRIGEMINAL NERVE DECOMPRESSION Right 2016    FAMILY HISTORY: Family History  Problem Relation Age of Onset   Alcohol abuse Mother    Arthritis Mother    Mental retardation Mother    Early death Mother 28       per patient, grief   Cancer Mother        lung   Alcohol abuse Father    Cancer Father        everywhere   Arthritis Father    Depression Sister    Alcohol abuse Sister    Multiple sclerosis Sister    Alzheimer's disease Sister    Mental retardation Sister        DOWNS   Cancer Maternal Grandfather        throat    SOCIAL HISTORY:  Social History   Socioeconomic History   Marital status: Married    Spouse name: Elta [place]   Number of children: Not on file   Years of education: 12   Highest education level: Not on file  Occupational History   Occupation: disabled    Comment: depression/PTSD  Tobacco Use   Smoking status: Former    Packs/day: 2.00    Years: 54.00    Total pack years: 108.00    Types: E-cigarettes, Cigarettes    Quit date: 03/08/2021    Years since  quitting: 1.5   Smokeless tobacco: Former  Scientific laboratory technician Use: Former  Substance and Sexual Activity   Alcohol use: Not Currently    Comment: 03-26-16 per pt once every 6 months   Drug use: No    Comment: 03-26-16 per pt no   Sexual activity: Not Currently  Other Topics Concern   Not on file  Social History Narrative   Disabled mental illness   Previously was a [employer]ar[employer] manager for 30 years   Lives with [place] and  sister Horris Latino and    Royston Cowper, age 54   09/25/22 Lives at home with husband and sister.  Children 3.  Education   12th grade, trade school.   Social Determinants of Health   Financial Resource Strain: Not on file  Food Insecurity: Not on file  Transportation Needs: Not on file  Physical Activity: Not on file  Stress: Not on file  Social Connections: Not on file  Intimate Partner Violence: Not on file     PHYSICAL EXAM  GENERAL EXAM/CONSTITUTIONAL: Vitals:  Vitals:   09/25/22 1435  BP: 116/69  Pulse: 68  Weight: 107 lb 12.8 oz (48.9 kg)  Height: '4\' 9"'$  (1.448 m)   Body mass index is 23.33 kg/m. No results found.  Patient is in no distress; well developed, nourished and groomed; neck is supple SMELLS OF CIG SMOKE!!  CARDIOVASCULAR: Examination of carotid arteries is normal; no carotid bruits Regular rate and rhythm, no murmurs Examination of peripheral vascular system by observation and palpation is normal  EYES: Ophthalmoscopic exam of optic discs and posterior segments is normal; no papilledema or hemorrhages  MUSCULOSKELETAL: Gait, strength, tone, movements noted in Neurologic exam below  NEUROLOGIC: MENTAL STATUS:     09/25/2022    2:42 PM  MMSE - Mini Mental State Exam  Orientation to time 3  Orientation to Place 3  Registration 3  Attention/ Calculation 0  Recall 1  Language- name 2 objects 2  Language- repeat 0  Language- follow 3 step command 3  Language- read & follow direction 1  Write a sentence 1  Copy design 0  Copy design-comments  hand shakey  Total score 17   awake, alert, oriented to person Decr attention Fund of knowledge appropriate  CRANIAL NERVE:  2nd - no papilledema on fundoscopic exam 2nd, 3rd, 4th, 6th - pupils equal and reactive to light, visual fields full to confrontation, extraocular muscles intact, no nystagmus 5th - facial sensation symmetric 7th - facial strength symmetric 8th - hearing intact 9th - palate elevates symmetrically, uvula midline 11th - shoulder shrug symmetric 12th - tongue protrusion midline  MOTOR:  normal bulk and tone, full strength in the BUE, BLE  SENSORY:  normal and symmetric to light touch, temperature, vibration  COORDINATION:  finger-nose-finger, fine finger movements normal  REFLEXES:  deep tendon reflexes TRACE and symmetric  GAIT/STATION:  narrow based gait    DIAGNOSTIC DATA (LABS, IMAGING, TESTING) - I reviewed patient records, labs, notes, testing and imaging myself where available.  Lab Results  Component Value Date   WBC 9.8 06/19/2021   HGB 14.4 06/19/2021   HCT 42.7 06/19/2021   MCV 96.2 06/19/2021   PLT 359 06/19/2021      Component Value Date/Time   NA 138 06/19/2021 1537   K 4.3 06/19/2021 1537   CL 106 06/19/2021 1537   CO2 24 06/19/2021 1537   GLUCOSE 116 (H) 06/19/2021 1537   BUN 16 06/19/2021 1537   CREATININE 1.12 (H) 06/19/2021 1537   CREATININE 1.20 (H) 06/25/2017 1502   CALCIUM 8.9 06/19/2021 1537   PROT 7.5 09/28/2018 1607   ALBUMIN 4.3 09/28/2018 1607   AST 23 09/28/2018 1607   ALT 15 09/28/2018 1607   ALKPHOS 66 09/28/2018 1607   BILITOT 0.8 09/28/2018 1607   GFRNONAA 54 (L) 06/19/2021 1537   GFRNONAA 48 (L) 06/25/2017 1502   GFRAA >60 09/28/2018 1607   GFRAA 55 (L) 06/25/2017 1502   Lab Results  Component Value Date   CHOL  124 09/28/2018   HDL 39 (L) 09/28/2018   LDLCALC 72 09/28/2018   TRIG 65 09/28/2018   CHOLHDL 3.2 09/28/2018   No results found for: "HGBA1C" Lab Results  Component Value Date    VITAMINB12 768 06/25/2017   Lab Results  Component Value Date   TSH 0.282 (L) 06/19/2021     12/20/09 MRI brain  - Moderate small vessel disease, likely secondary to hypertension. - No acute intracranial abnormality.  12/20/09 MRA head  - Unremarkable MR angiography of the intracranial circulation.  08/21/15 CT head  1.  No CT evidence of an acute intracranial abnormality or mass lesion.  2.  Left fetal-type PCA.  10/02/17 MRI brain MRI of the brain with and without contrast shows the following: 1.   Extensive T2/FLAIR hyperintense foci in the hemispheres and pons consistent with moderately severe chronic microvascular ischemic change. There are confluencies in the right corona radiata and bilateral periatrial white matter. When compared to the MRI dated 12/20/2009, there has been significant progression of these changes. 2.    There are no acute findings. There is a normal enhancement pattern.     ASSESSMENT AND PLAN  71 y.o. year old female here with:   Dx:  1. Memory loss   2. Alzheimer's disease, unspecified (CODE) ([place])      PLAN:  MEMORY LOSS (mmse 17/30; suspect mild-mod dementia) - consider memantine '10mg'$  at bedtime; increase to twice a day after 1-2 weeks (but needs supervision of meds) - safety / supervision issues reviewed - daily physical activity / exercise (at least 15-30 minutes) - eat more plants / vegetables - increase social activities, brain stimulation, games, puzzles, hobbies, crafts, arts, music - aim for at least 7-8 hours sleep per night (or more) - avoid smoking and alcohol - caution with medications, finances; no driving  Orders Placed This Encounter  Procedures   MR BRAIN WO CONTRAST   Return for return to PCP, pending if symptoms worsen or fail to improve.    Penni Bombard, MD 29/04/6212, 0:86 PM Certified in Neurology, Neurophysiology and Neuroimaging  [facility] [address], [place],  [place] [address] [phone]

## 2023-01-14 DIAGNOSIS — J441 Chronic obstructive pulmonary disease with (acute) exacerbation: Secondary | ICD-10-CM | POA: Diagnosis not present

## 2023-01-14 DIAGNOSIS — J209 Acute bronchitis, unspecified: Secondary | ICD-10-CM | POA: Diagnosis not present

## 2023-01-14 DIAGNOSIS — R0902 Hypoxemia: Secondary | ICD-10-CM | POA: Diagnosis not present

## 2023-01-14 DIAGNOSIS — R079 Chest pain, unspecified: Secondary | ICD-10-CM | POA: Diagnosis not present

## 2023-02-14 DIAGNOSIS — R079 Chest pain, unspecified: Secondary | ICD-10-CM | POA: Diagnosis not present

## 2023-02-14 DIAGNOSIS — R0902 Hypoxemia: Secondary | ICD-10-CM | POA: Diagnosis not present

## 2023-02-14 DIAGNOSIS — J209 Acute bronchitis, unspecified: Secondary | ICD-10-CM | POA: Diagnosis not present

## 2023-02-14 DIAGNOSIS — J441 Chronic obstructive pulmonary disease with (acute) exacerbation: Secondary | ICD-10-CM | POA: Diagnosis not present

## 2023-02-26 DIAGNOSIS — E079 Disorder of thyroid, unspecified: Secondary | ICD-10-CM | POA: Diagnosis not present

## 2023-02-26 DIAGNOSIS — Z720 Tobacco use: Secondary | ICD-10-CM | POA: Diagnosis not present

## 2023-03-11 DIAGNOSIS — N6489 Other specified disorders of breast: Secondary | ICD-10-CM | POA: Diagnosis not present

## 2023-03-11 DIAGNOSIS — R928 Other abnormal and inconclusive findings on diagnostic imaging of breast: Secondary | ICD-10-CM | POA: Diagnosis not present

## 2023-03-11 DIAGNOSIS — N644 Mastodynia: Secondary | ICD-10-CM | POA: Diagnosis not present

## 2023-03-11 DIAGNOSIS — N6311 Unspecified lump in the right breast, upper outer quadrant: Secondary | ICD-10-CM | POA: Diagnosis not present

## 2023-03-15 DIAGNOSIS — R079 Chest pain, unspecified: Secondary | ICD-10-CM | POA: Diagnosis not present

## 2023-03-15 DIAGNOSIS — J209 Acute bronchitis, unspecified: Secondary | ICD-10-CM | POA: Diagnosis not present

## 2023-03-15 DIAGNOSIS — J441 Chronic obstructive pulmonary disease with (acute) exacerbation: Secondary | ICD-10-CM | POA: Diagnosis not present

## 2023-03-15 DIAGNOSIS — R0902 Hypoxemia: Secondary | ICD-10-CM | POA: Diagnosis not present

## 2023-03-20 DIAGNOSIS — R0602 Shortness of breath: Secondary | ICD-10-CM | POA: Diagnosis not present

## 2023-03-20 DIAGNOSIS — R059 Cough, unspecified: Secondary | ICD-10-CM | POA: Diagnosis not present

## 2023-03-20 DIAGNOSIS — Z87891 Personal history of nicotine dependence: Secondary | ICD-10-CM | POA: Diagnosis not present

## 2023-03-20 DIAGNOSIS — J441 Chronic obstructive pulmonary disease with (acute) exacerbation: Secondary | ICD-10-CM | POA: Diagnosis not present

## 2023-03-20 DIAGNOSIS — Z7982 Long term (current) use of aspirin: Secondary | ICD-10-CM | POA: Diagnosis not present

## 2023-03-20 DIAGNOSIS — R911 Solitary pulmonary nodule: Secondary | ICD-10-CM | POA: Diagnosis not present

## 2023-03-20 DIAGNOSIS — Z880 Allergy status to penicillin: Secondary | ICD-10-CM | POA: Diagnosis not present

## 2023-03-20 DIAGNOSIS — I2510 Atherosclerotic heart disease of native coronary artery without angina pectoris: Secondary | ICD-10-CM | POA: Diagnosis not present

## 2023-03-20 DIAGNOSIS — Z955 Presence of coronary angioplasty implant and graft: Secondary | ICD-10-CM | POA: Diagnosis not present

## 2023-03-20 DIAGNOSIS — Z20822 Contact with and (suspected) exposure to covid-19: Secondary | ICD-10-CM | POA: Diagnosis not present

## 2023-03-23 DIAGNOSIS — Z20822 Contact with and (suspected) exposure to covid-19: Secondary | ICD-10-CM | POA: Diagnosis not present

## 2023-03-23 DIAGNOSIS — Z888 Allergy status to other drugs, medicaments and biological substances status: Secondary | ICD-10-CM | POA: Diagnosis not present

## 2023-03-23 DIAGNOSIS — J449 Chronic obstructive pulmonary disease, unspecified: Secondary | ICD-10-CM | POA: Diagnosis not present

## 2023-03-23 DIAGNOSIS — J441 Chronic obstructive pulmonary disease with (acute) exacerbation: Secondary | ICD-10-CM | POA: Diagnosis not present

## 2023-03-23 DIAGNOSIS — I2510 Atherosclerotic heart disease of native coronary artery without angina pectoris: Secondary | ICD-10-CM | POA: Diagnosis not present

## 2023-03-23 DIAGNOSIS — I10 Essential (primary) hypertension: Secondary | ICD-10-CM | POA: Diagnosis not present

## 2023-03-23 DIAGNOSIS — F32A Depression, unspecified: Secondary | ICD-10-CM | POA: Diagnosis not present

## 2023-03-23 DIAGNOSIS — K219 Gastro-esophageal reflux disease without esophagitis: Secondary | ICD-10-CM | POA: Diagnosis not present

## 2023-03-23 DIAGNOSIS — E079 Disorder of thyroid, unspecified: Secondary | ICD-10-CM | POA: Diagnosis not present

## 2023-03-23 DIAGNOSIS — Z880 Allergy status to penicillin: Secondary | ICD-10-CM | POA: Diagnosis not present

## 2023-03-23 DIAGNOSIS — Z79899 Other long term (current) drug therapy: Secondary | ICD-10-CM | POA: Diagnosis not present

## 2023-03-23 DIAGNOSIS — Z7722 Contact with and (suspected) exposure to environmental tobacco smoke (acute) (chronic): Secondary | ICD-10-CM | POA: Diagnosis not present

## 2023-03-23 DIAGNOSIS — E782 Mixed hyperlipidemia: Secondary | ICD-10-CM | POA: Diagnosis not present

## 2023-03-23 DIAGNOSIS — R059 Cough, unspecified: Secondary | ICD-10-CM | POA: Diagnosis not present

## 2023-03-23 DIAGNOSIS — Z87891 Personal history of nicotine dependence: Secondary | ICD-10-CM | POA: Diagnosis not present

## 2023-03-23 DIAGNOSIS — Z1152 Encounter for screening for COVID-19: Secondary | ICD-10-CM | POA: Diagnosis not present

## 2023-04-09 DIAGNOSIS — M810 Age-related osteoporosis without current pathological fracture: Secondary | ICD-10-CM | POA: Diagnosis not present

## 2023-04-15 DIAGNOSIS — R079 Chest pain, unspecified: Secondary | ICD-10-CM | POA: Diagnosis not present

## 2023-04-15 DIAGNOSIS — J209 Acute bronchitis, unspecified: Secondary | ICD-10-CM | POA: Diagnosis not present

## 2023-04-15 DIAGNOSIS — J441 Chronic obstructive pulmonary disease with (acute) exacerbation: Secondary | ICD-10-CM | POA: Diagnosis not present

## 2023-04-15 DIAGNOSIS — R0902 Hypoxemia: Secondary | ICD-10-CM | POA: Diagnosis not present

## 2023-04-16 DIAGNOSIS — T148XXA Other injury of unspecified body region, initial encounter: Secondary | ICD-10-CM | POA: Diagnosis not present

## 2023-04-16 DIAGNOSIS — M810 Age-related osteoporosis without current pathological fracture: Secondary | ICD-10-CM | POA: Diagnosis not present

## 2023-04-16 DIAGNOSIS — F17210 Nicotine dependence, cigarettes, uncomplicated: Secondary | ICD-10-CM | POA: Diagnosis not present

## 2023-04-16 DIAGNOSIS — R911 Solitary pulmonary nodule: Secondary | ICD-10-CM | POA: Diagnosis not present

## 2023-05-15 DIAGNOSIS — R079 Chest pain, unspecified: Secondary | ICD-10-CM | POA: Diagnosis not present

## 2023-05-15 DIAGNOSIS — J209 Acute bronchitis, unspecified: Secondary | ICD-10-CM | POA: Diagnosis not present

## 2023-05-15 DIAGNOSIS — R0902 Hypoxemia: Secondary | ICD-10-CM | POA: Diagnosis not present

## 2023-05-15 DIAGNOSIS — J441 Chronic obstructive pulmonary disease with (acute) exacerbation: Secondary | ICD-10-CM | POA: Diagnosis not present

## 2023-06-02 DIAGNOSIS — Z880 Allergy status to penicillin: Secondary | ICD-10-CM | POA: Diagnosis not present

## 2023-06-02 DIAGNOSIS — F32A Depression, unspecified: Secondary | ICD-10-CM | POA: Diagnosis not present

## 2023-06-02 DIAGNOSIS — Z955 Presence of coronary angioplasty implant and graft: Secondary | ICD-10-CM | POA: Diagnosis not present

## 2023-06-02 DIAGNOSIS — Z720 Tobacco use: Secondary | ICD-10-CM | POA: Diagnosis not present

## 2023-06-02 DIAGNOSIS — K219 Gastro-esophageal reflux disease without esophagitis: Secondary | ICD-10-CM | POA: Diagnosis not present

## 2023-06-02 DIAGNOSIS — Z79899 Other long term (current) drug therapy: Secondary | ICD-10-CM | POA: Diagnosis not present

## 2023-06-02 DIAGNOSIS — J449 Chronic obstructive pulmonary disease, unspecified: Secondary | ICD-10-CM | POA: Diagnosis not present

## 2023-06-02 DIAGNOSIS — Z7982 Long term (current) use of aspirin: Secondary | ICD-10-CM | POA: Diagnosis not present

## 2023-06-02 DIAGNOSIS — I10 Essential (primary) hypertension: Secondary | ICD-10-CM | POA: Diagnosis not present

## 2023-06-02 DIAGNOSIS — M810 Age-related osteoporosis without current pathological fracture: Secondary | ICD-10-CM | POA: Diagnosis not present

## 2023-06-02 DIAGNOSIS — Z7951 Long term (current) use of inhaled steroids: Secondary | ICD-10-CM | POA: Diagnosis not present

## 2023-06-02 DIAGNOSIS — E079 Disorder of thyroid, unspecified: Secondary | ICD-10-CM | POA: Diagnosis not present

## 2023-06-02 DIAGNOSIS — Z888 Allergy status to other drugs, medicaments and biological substances status: Secondary | ICD-10-CM | POA: Diagnosis not present

## 2023-06-02 DIAGNOSIS — R262 Difficulty in walking, not elsewhere classified: Secondary | ICD-10-CM | POA: Diagnosis not present

## 2023-06-02 DIAGNOSIS — Z9981 Dependence on supplemental oxygen: Secondary | ICD-10-CM | POA: Diagnosis not present

## 2023-06-02 DIAGNOSIS — M545 Low back pain, unspecified: Secondary | ICD-10-CM | POA: Diagnosis not present

## 2023-06-15 DIAGNOSIS — J209 Acute bronchitis, unspecified: Secondary | ICD-10-CM | POA: Diagnosis not present

## 2023-06-15 DIAGNOSIS — R079 Chest pain, unspecified: Secondary | ICD-10-CM | POA: Diagnosis not present

## 2023-06-15 DIAGNOSIS — J441 Chronic obstructive pulmonary disease with (acute) exacerbation: Secondary | ICD-10-CM | POA: Diagnosis not present

## 2023-06-15 DIAGNOSIS — R0902 Hypoxemia: Secondary | ICD-10-CM | POA: Diagnosis not present

## 2023-06-17 DIAGNOSIS — M545 Low back pain, unspecified: Secondary | ICD-10-CM | POA: Diagnosis not present

## 2023-06-17 DIAGNOSIS — G8929 Other chronic pain: Secondary | ICD-10-CM | POA: Diagnosis not present

## 2023-07-03 DIAGNOSIS — M6281 Muscle weakness (generalized): Secondary | ICD-10-CM | POA: Diagnosis not present

## 2023-07-03 DIAGNOSIS — G8929 Other chronic pain: Secondary | ICD-10-CM | POA: Diagnosis not present

## 2023-07-03 DIAGNOSIS — R29898 Other symptoms and signs involving the musculoskeletal system: Secondary | ICD-10-CM | POA: Diagnosis not present

## 2023-07-03 DIAGNOSIS — M2569 Stiffness of other specified joint, not elsewhere classified: Secondary | ICD-10-CM | POA: Diagnosis not present

## 2023-07-03 DIAGNOSIS — M545 Low back pain, unspecified: Secondary | ICD-10-CM | POA: Diagnosis not present

## 2023-07-03 DIAGNOSIS — Z9181 History of falling: Secondary | ICD-10-CM | POA: Diagnosis not present

## 2023-07-03 DIAGNOSIS — R2689 Other abnormalities of gait and mobility: Secondary | ICD-10-CM | POA: Diagnosis not present

## 2023-07-03 DIAGNOSIS — M810 Age-related osteoporosis without current pathological fracture: Secondary | ICD-10-CM | POA: Diagnosis not present

## 2023-07-15 DIAGNOSIS — M2569 Stiffness of other specified joint, not elsewhere classified: Secondary | ICD-10-CM | POA: Diagnosis not present

## 2023-07-15 DIAGNOSIS — R2689 Other abnormalities of gait and mobility: Secondary | ICD-10-CM | POA: Diagnosis not present

## 2023-07-15 DIAGNOSIS — M545 Low back pain, unspecified: Secondary | ICD-10-CM | POA: Diagnosis not present

## 2023-07-15 DIAGNOSIS — R079 Chest pain, unspecified: Secondary | ICD-10-CM | POA: Diagnosis not present

## 2023-07-15 DIAGNOSIS — R29898 Other symptoms and signs involving the musculoskeletal system: Secondary | ICD-10-CM | POA: Diagnosis not present

## 2023-07-15 DIAGNOSIS — J441 Chronic obstructive pulmonary disease with (acute) exacerbation: Secondary | ICD-10-CM | POA: Diagnosis not present

## 2023-07-15 DIAGNOSIS — Z9181 History of falling: Secondary | ICD-10-CM | POA: Diagnosis not present

## 2023-07-15 DIAGNOSIS — G8929 Other chronic pain: Secondary | ICD-10-CM | POA: Diagnosis not present

## 2023-07-15 DIAGNOSIS — M810 Age-related osteoporosis without current pathological fracture: Secondary | ICD-10-CM | POA: Diagnosis not present

## 2023-07-15 DIAGNOSIS — M6281 Muscle weakness (generalized): Secondary | ICD-10-CM | POA: Diagnosis not present

## 2023-07-15 DIAGNOSIS — J209 Acute bronchitis, unspecified: Secondary | ICD-10-CM | POA: Diagnosis not present

## 2023-07-15 DIAGNOSIS — R0902 Hypoxemia: Secondary | ICD-10-CM | POA: Diagnosis not present

## 2023-07-21 DIAGNOSIS — M545 Low back pain, unspecified: Secondary | ICD-10-CM | POA: Diagnosis not present

## 2023-07-21 DIAGNOSIS — R2689 Other abnormalities of gait and mobility: Secondary | ICD-10-CM | POA: Diagnosis not present

## 2023-07-21 DIAGNOSIS — G8929 Other chronic pain: Secondary | ICD-10-CM | POA: Diagnosis not present

## 2023-07-21 DIAGNOSIS — M2569 Stiffness of other specified joint, not elsewhere classified: Secondary | ICD-10-CM | POA: Diagnosis not present

## 2023-07-21 DIAGNOSIS — M6281 Muscle weakness (generalized): Secondary | ICD-10-CM | POA: Diagnosis not present

## 2023-07-21 DIAGNOSIS — Z9181 History of falling: Secondary | ICD-10-CM | POA: Diagnosis not present

## 2023-07-21 DIAGNOSIS — M810 Age-related osteoporosis without current pathological fracture: Secondary | ICD-10-CM | POA: Diagnosis not present

## 2023-07-21 DIAGNOSIS — R29898 Other symptoms and signs involving the musculoskeletal system: Secondary | ICD-10-CM | POA: Diagnosis not present

## 2023-08-15 DIAGNOSIS — J441 Chronic obstructive pulmonary disease with (acute) exacerbation: Secondary | ICD-10-CM | POA: Diagnosis not present

## 2023-08-15 DIAGNOSIS — R079 Chest pain, unspecified: Secondary | ICD-10-CM | POA: Diagnosis not present

## 2023-08-15 DIAGNOSIS — R0902 Hypoxemia: Secondary | ICD-10-CM | POA: Diagnosis not present

## 2023-08-15 DIAGNOSIS — J209 Acute bronchitis, unspecified: Secondary | ICD-10-CM | POA: Diagnosis not present

## 2023-08-29 ENCOUNTER — Other Ambulatory Visit: Payer: Self-pay | Admitting: Family Medicine

## 2023-09-04 DIAGNOSIS — R21 Rash and other nonspecific skin eruption: Secondary | ICD-10-CM | POA: Diagnosis not present

## 2023-09-04 DIAGNOSIS — L259 Unspecified contact dermatitis, unspecified cause: Secondary | ICD-10-CM | POA: Diagnosis not present

## 2023-09-04 DIAGNOSIS — Z6821 Body mass index (BMI) 21.0-21.9, adult: Secondary | ICD-10-CM | POA: Diagnosis not present

## 2023-09-04 DIAGNOSIS — I10 Essential (primary) hypertension: Secondary | ICD-10-CM | POA: Diagnosis not present

## 2023-09-07 DIAGNOSIS — Z23 Encounter for immunization: Secondary | ICD-10-CM | POA: Diagnosis not present

## 2023-09-07 DIAGNOSIS — E079 Disorder of thyroid, unspecified: Secondary | ICD-10-CM | POA: Diagnosis not present

## 2023-10-21 DIAGNOSIS — E079 Disorder of thyroid, unspecified: Secondary | ICD-10-CM | POA: Diagnosis not present

## 2023-12-02 DIAGNOSIS — G4700 Insomnia, unspecified: Secondary | ICD-10-CM | POA: Diagnosis not present

## 2023-12-02 DIAGNOSIS — R233 Spontaneous ecchymoses: Secondary | ICD-10-CM | POA: Diagnosis not present

## 2023-12-02 DIAGNOSIS — R252 Cramp and spasm: Secondary | ICD-10-CM | POA: Diagnosis not present

## 2023-12-02 DIAGNOSIS — E079 Disorder of thyroid, unspecified: Secondary | ICD-10-CM | POA: Diagnosis not present

## 2024-01-18 DIAGNOSIS — R222 Localized swelling, mass and lump, trunk: Secondary | ICD-10-CM | POA: Diagnosis not present

## 2024-01-18 DIAGNOSIS — K439 Ventral hernia without obstruction or gangrene: Secondary | ICD-10-CM | POA: Diagnosis not present

## 2024-01-18 DIAGNOSIS — E039 Hypothyroidism, unspecified: Secondary | ICD-10-CM | POA: Diagnosis not present

## 2024-04-14 ENCOUNTER — Encounter: Payer: Self-pay | Admitting: *Deleted

## 2024-07-26 NOTE — Progress Notes (Signed)
 Chief concern/reason for visit:  Follow-up (Wants to discuss having further testing for abnormal lung cancer screening)    Assessment & Plan Abnormal CT scan of lung Plan is to complete a PET scan and follow-up with pulmonology for further evaluation. Orders: .  BRIC PET MR Skull Base Mid Thigh; Future .  Pulmonology; Future  Coronary artery disease involving native coronary artery of native heart without angina pectoris Patient is currently undergoing CAD treatment with cardiology.    Lung mass Discussed the need for following up with pulmonology as well as a PET scan. Orders: .  albuterol  HFA 90 mcg/actuation inhaler; Inhale 2 puffs every four (4) hours as needed for wheezing. .  fluticasone-umeclidin-vilanter (TRELEGY ELLIPTA) 200-62.5-25 mcg inhaler; Inhale 1 puff in the morning. .  Pulmonology; Future  Anxiety Plan is to start hydroxyzine as needed for anxiety. Orders: .  hydrOXYzine (ATARAX) 25 MG tablet; Take 1 tablet (25 mg total) by mouth Three (3) times a day as needed for anxiety.  Chronic right-sided low back pain without sciatica Plan is to increase meloxicam as well as apply lidocaine  patches to help better control pain. Orders: .  meloxicam (MOBIC) 15 MG tablet; Take 1 tablet (15 mg total) by mouth daily. Do not take ibuprofen or aspirin, can take Tylenol  if needed to help with pain .  lidocaine  (LIDODERM ) 5 % patch; Place 1 patch on the skin every twelve (12) hours. Apply to affected area for 12 hours only each day (then remove patch)  COPD exacerbation    Patient is requesting referral to pulmonology in [place].  The plan is to use the inhalers as directed and needed.  Orders: .  albuterol  HFA 90 mcg/actuation inhaler; Inhale 2 puffs every four (4) hours as needed for wheezing. .  fluticasone-umeclidin-vilanter (TRELEGY ELLIPTA) 200-62.5-25 mcg inhaler; Inhale 1 puff in the morning. .  Pulmonology; Future  Centrilobular emphysema    Patient reports  she previously saw pulmonology in [place] and would like a new referral.  Orders: .  Pulmonology; Future     Return in about 4 weeks (around 08/23/2024) for Recheck.  Subjective HPI Patient presents for lung cancer result follow-up. We are unable to reach the patient by phone and had to send a letter for them to follow-up in the office. CT lung cancer screening was completed 07/13/2024 IMPRESSION:   1.    New subpleural confluent opacity in the lateral right upper lobe with associated nodular component measuring 15 x 11 mm. This is indeterminate and may be infectious/inflammatory or neoplastic in nature. PET/CT is recommended for further characterization and to exclude neoplasm. 2.    Scattered additional micronodules, similar to prior and likely benign. 3.    Emphysema with bronchial wall thickening and mucous plugging in the lung bases, concerning for bronchitis. Early nonspecific fibrosis in the bases. 4.    Severe coronary artery calcifications.   Recommendations: Lung-RADS 4A: Suspicious - Findings for which additional diagnostic testing and/or tissue sampling is recommended.    Patient reports that she has been trying to quit smoking cigarettes.  The patient reports that her husband smokes constantly and makes his own cigarettes that is making her have increased anxiety and irritability.   Objective The following portions of the patient's chart were reviewed in this encounter and updated as appropriate: allergies, medications, problems, medical history, surgical history, and family history.  BP 128/70   Pulse 79   Temp 36.6 C (97.9 F) (Temporal)   Resp 14   Ht 149.9  cm (4' 11)   Wt 54.6 kg (120 lb 4.8 oz)   LMP  (LMP Unknown)   SpO2 97%   BMI 24.30 kg/m   Physical Exam: Physical Exam Vitals and nursing note reviewed.  Constitutional:      General: She is not in acute distress.    Appearance: Normal appearance. She is not ill-appearing, toxic-appearing or  diaphoretic.  HENT:     Head: Normocephalic.     Right Ear: External ear normal.     Left Ear: External ear normal.     Nose: Nose normal.  Eyes:     Extraocular Movements: Extraocular movements intact.  Cardiovascular:     Rate and Rhythm: Normal rate and regular rhythm.     Heart sounds: No murmur heard. Pulmonary:     Effort: Pulmonary effort is normal. No respiratory distress.     Breath sounds: Wheezing present. No rhonchi or rales.  Musculoskeletal:     Cervical back: Normal range of motion and neck supple.     Comments: Decreased ROM bilateral LE  Skin:    General: Skin is warm.     Findings: No rash.  Neurological:     General: No focal deficit present.     Mental Status: She is alert and oriented to person, place, and time. Mental status is at baseline.     Motor: Weakness and tremor present.     Gait: Gait abnormal (Unsteady).  Psychiatric:        Mood and Affect: Mood is anxious.        Speech: Speech is slurred.        Behavior: Behavior is cooperative.          I personally spent 25 minutes face-to-face and non-face-to-face in the care of this patient, which includes all pre, intra, and post visit time on the date of service.      Lauraine Fus, NP

## 2024-08-02 NOTE — Telephone Encounter (Signed)
 Called and spoke with patient patient made aware that provider has ordered Sent 1 tablet of Valium,  to take 30 to 45 minutes prior to imaging. Patient has been advised not to drive on medication patient verbalizes understanding

## 2024-08-06 NOTE — ED Triage Notes (Signed)
 Patient states she woke up and it feels like her throat is swollen and it hurts to swallow.

## 2024-08-06 NOTE — ED Provider Notes (Addendum)
 Emergency Department Provider Note    ED Clinical Impression   Final diagnoses:  Tobacco abuse  Sialoadenitis of submandibular gland (Primary)    ED Assessment/Plan    Condition: Stable Disposition: Discharge  This chart has been completed using Dragon Medical Dictation software, and while attempts have been made to ensure accuracy, certain words and phrases may not be transcribed as intended.   History   Chief Complaint  Patient presents with  . Sore Throat   HPI  Kelly Chambers is a 73 y.o. female  who presents today to the  emergency department stating that she woke up approximately 2 hours prior to arrival complaining of mild swelling and discomfort in her left submandibular area.    She specifically denies sore throat or dysphagia.  No associated fever/foreign body sensation/difficulty swallowing/intraoral swelling. No preceding intraoral trauma.  No known or suspected flu/COVID/strep/mono exposures.  No runny nose/cough/myalgias.  She denies similar prior symptoms or history of sialolithiasis.  She denies any recent new over-the-counter or prescription medications.  No associated chest / shoulder / jaw pain, no new or unusual SOB.  No other concerns or complaints.   PMH: History anxiety, depression, low thyroid , hypertension, COPD, interstitial lung disease on home O2 per medical records but patient states she no longer has home O2. 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  PSHx:  has a past surgical history that includes Gallbladder surgery; Cesarean section; Hysterectomy; Trigeminal nerve decompression (Right, 2016); Cholecystectomy; Coronary angioplasty with stent (2017); Cardiac catheterization; Oophorectomy; pr colonoscopy flx dx w/collj spec when pfrmd (N/A, 11/25/2021); and pr upper gi endoscopy,biopsy (N/A, 11/25/2021).  SocHx:  reports that she quit smoking about 18 months ago. Her smoking use included cigarettes. She started smoking about 16 months ago. She has a 51.3 pack-year smoking history. She has been exposed to tobacco smoke. She has never used smokeless tobacco. She reports that she does not currently use alcohol . She reports that she does not use drugs.  Allergies: is allergic to penicillins and trazodone .  (Denies allergy to trazodone )  Medications: has a current medication list which includes the following long-term medication(s): [DISCONTINUED] albuterol , [DISCONTINUED] amlodipine, [DISCONTINUED] aspirin, [DISCONTINUED] atorvastatin, [DISCONTINUED] escitalopram oxalate, [DISCONTINUED] levothyroxine, and [DISCONTINUED] lidocaine .  Allergies, Medications, Medical, Surgical, and Social History were reviewed as documented above.   Chart Review: - primary NP note 8/6 reviewed  - Chest CT 06/2024 showed COPD, New subpleural confluent opacity in the lateral right upper lobe with associated nodular component measuring 15 x 11 mm. This is indeterminate and may be infectious/inflammatory or neoplastic in nature and Severe coronary artery calcifications.  Pt has known h/o CAD per note.   Review Of Systems  {As per HPI    Physical Exam   BP 158/81   Pulse 84   Temp 36.3 C (97.4 F) (Temporal)   Resp 18   Ht 149.9 cm (4' 11)   Wt 55.9 kg (123 lb  3.2 oz)   LMP  (LMP Unknown)   SpO2 96%   BMI 24.88 kg/m   Body mass index is 24.88 kg/m.    Constitutional: Vital signs reviewed, somewhat older than  stated age, appears somewhat chronically ill but currently fairly well appearing, Patient appears comfortable, There is no acute distress, Not ill or toxic appearing. Head: Atraumatic Eyes: Normal to inspection. ENT: Mildly dry MM, normal posterior OP, no tonsillar erythema/exudate/mass, uvula midline/ WNL.  Patient has mildly tender trace palpable swelling in the left submandibular area only, normal appearance on the right, no overlying erythema, no fluctuance or firmness, no intraoral angioedema or ductal abnormalities, normal sublingual space intraorally b/l - no Ludwigs, no intraoral sublingual TTP/erythema, normal parotids bilaterally, no trismus or bony TTP Neck: Normal ROM.  No cervical/submandibular LAD. Respiratory: No respiratory distress, Speaks full sentences,nl phonation, normal respiratory rate and effort, Unlabored.  Normal O2 sat CV: Normal Rate Upper Extremity: Normal range of motion. Lower Extremity: Normal range of motion Skin: Skin is warm, dry, normal color, No rash. Neuro: Speech normal, Normal mental status, MAEW equally. Psych: +mildly anxious affect, Alert, oriented.   ED Course   Procedures      MEDICAL DECISION MAKING AND PLAN OF CARE   9:05 AM exam and HPI c/w very mild left submandibular sialoadenitis without concerning signs or symptoms.  There is no angioedema/airway involvement, no infectious signs or symptoms, no evid of bacterial infxn / cellulitis / abscess,  no Ludwig's, nl posterior OP.  Pt denies pharyngeal sx /discomfort - c/o L submandibular discomfort only.  Patient is well-appearing, normal phonation, no neck pain or stiffness, no meningeal signs.  I discussed with patient treatment for this is typically home treatment with warm compresses, NSAIDs, sour candy.  I had extensive d/w pt re: signs / sx to return for - verbal and written SRI given. Recommend follow-up with PMD if not improved in 2-3 days, or sooner if worse.    Medical screening exam was  completed.  No emergency medical condition was identified.    Extensive d/w pt     -  Pt understands dx   and agrees with plan as above, close outpt f/u , agrees to return if any signs or symptoms of worsening or if not improving as expected.   Pt ambulates easily and without limitation on discharge.  After careful consideration of the patient's presentation and clinical course, at this time there does not appear to be an indication for further emergent evaluation or intervention, nor is there an indication for admission to the hospital.  At the time of discharge, I do not think, to the best of my medical judgment, that an emergent medical condition exists, and the patient is discharged home well-appearing, in stable and satisfactory condition.  Discharge diagnosis, instructions and plan were discussed and understood.  At the time of discharge the patient appeared comfortable and was in no apparent distress.  Extensive verbal and written care and strict return instructions were given by me. The patient was instructed that if their symptoms worsened or if they have any additional concerns that they should return to the emergency room immediately.   MEDICAL DECISION MAKING  IMCJ, LOW SUSPICION FOR VERTEBRAL OR CAROTID ARTERY DISSECTION / FOCAL NEURO DEFICIT / TRAUMA / VERTEBRAL FX / OSTEOMYELITIS / MASTOIDITIS / OM / DENTAL TRAUMA OR INFXN / MENINGITIS / EPIDURAL ABSCESS OR HEMATOMA / ACUTE DISC DZ / TRANSVERSE MYELITIS / RADICULOPATHY / RP ABSCESS / TORTICOLLIS / DYSTONIC RXN /  NECK MASS / BRANCHIAL CLEFT CYST /THYROGLOSSAL DUCT CYST /  LUDWIG'S ANGINA / BACTERIAL SIALADENITIS / FACIAL CELLULITIS OR ABSCESS / LYMPHADENITIS / INTRACRANIAL HEMORRHAGE OR MASS / DURAL VENOUS SINUS THROMBOSIS / POSTERIOR FOSSA MASS / MUMPS / LYMPHOMA / SHINGLES / STREP / MONO / COVID / ACUTE PHARYNGITIS.    Notes: Prior to seeing the patient, the triage note and nursing notes were reviewed, and I agree, except for where my  documentation differs.  Old records obtained, reviewed, and summarized in PMH (unless unavailable). All laboratory values, imaging studies, and EKG's were personally reviewed by me (when ordered). Additional history obtained from:     Evonnie Redder, MD Emergency Attending Physician    ED Results No results found for any visits on 08/06/24. No results found.  Medications Administered: Medications - No data to display  Discharge Medications (Medications Prescribed during this  ED visit and Patient's Home Medications) :    Your Medication List     ASK your doctor about these medications    diazePAM 2 MG tablet Commonly known as: VALIUM Take 1 tablet (2 mg total) by mouth 1 (one) time if needed for anxiety for up to 1 dose. For imaging on 08/11/2024 Ask about: Should I take this medication?          Redder Evonnie SAILOR, MD 08/16/24 1737    Redder Evonnie SAILOR, MD 08/25/24 [id number]

## 2024-08-08 NOTE — Progress Notes (Signed)
 Chief concern/reason for visit:  Sore Throat (Patient states that she went to [facility] yesterday because her throat was very sore. When she woke up this morning, her chin was hurting and she noticed some swelling, pain radiating up her neck towards her ears. States that pain is worse when she swallows.)    Assessment & Plan Sore throat Strep test is negative. Magic mouthwash was sent in to help the patient with throat pain. Orders: .  POCT Rapid Group A Strep .  diphenhydrAMINE (BENADRYL) 12.5 mg/5 mL liquid; Mix equal parts diphenhydramine, lidocaine , and liquid antacid. Swish 5 to 10mLs in mouth for 60 seconds, every 4 to 6 hours as needed, then swallow or spit mixture out (as directed by prescriber). Shake well before using. Refrigerate after mixing and discard after 14 days. .  lidocaine  (XYLOCAINE ) 2 % Soln; Mix equal parts diphenhydramine, lidocaine , and liquid antacid. Swish 5 to 10mLs in mouth for 60 seconds, every 4 to 6 hours as needed, then swallow or spit mixture out (as directed by prescriber). Shake well before using. Refrigerate after mixing and discard after 14 days. SABRA  alum-mag-simeth (MAALOX PLUS) 200-200-20 mg/5 mL Susp; Mix equal parts diphenhydramine, lidocaine , and liquid antacid. Swish 5 to 10mLs in mouth for 60 seconds, every 4 to 6 hours as needed, then swallow or spit mixture out (as directed by prescriber). Shake well before using. Refrigerate after mixing and discard after 14 days.  Upper respiratory tract infection, unspecified type The plan is to complete the entire course of antibiotics. Discussed diarrhea as a potential side effect and recommended probiotics to help with symptoms. Discussed the need for rest, and increasing fluids. Discussed OTC medication and treatment for symptoms.  Such as fever reducers/pain relievers, such as ibuprofen (Advil, Motrin IB, others) or acetaminophen  (Tylenol , others). As well as a decongestant such as allergy medication and/or mucinex  and cough syrup. Discussed if trouble breathing or shortness of breath occurs to proceed to the ER.  Orders: .  predniSONE  (DELTASONE ) 10 MG tablet; Take 3 tablets by mouth once daily in the morning x 3 days, then take 2 tablets by mouth once daily in the morning x 3 days, and then take 1 tablet by mouth once daily in the morning x 3 days. SABRA  sulfamethoxazole-trimethoprim (BACTRIM DS) 800-160 mg per tablet; Take 1 tablet (160 mg of trimethoprim total) by mouth two (2) times a day for 10 days.  Impacted cerumen, unspecified laterality Plan is to use the Debrox in the left ear and follow-up for additional earwax removal if needed. Orders: .  Ear wax removal .  carbamide peroxide (DEBROX) 6.5 % otic solution; Administer 5 drops into the left ear two (2) times a day.     Return if symptoms worsen or fail to improve.  Subjective Sore Throat  Sore Throat (Patient states that she went to [facility] yesterday because her throat was very sore. When she woke up this morning, her chin was hurting and she noticed some swelling, pain radiating up her neck towards her ears. States that pain is worse when she swallows.)   ER visit 08/06/24 Chief Complaint  Patient presents with  . Sore Throat    HPI   Sharion Grieves is a 73 y.o. female  who presents today to the  emergency department stating that she woke up approximately 2 hours prior to arrival complaining of mild swelling and discomfort in her left submandibular area.    She specifically denies sore throat or dysphagia.  No associated fever/foreign body sensation/difficulty swallowing/intraoral swelling.  No known or suspected flu/COVID/strep/mono exposures.  No runny nose/cough/myalgias.  She denies similar prior symptoms or history of sialolithiasis.  She denies any recent new over-the-counter or prescription medications.    No other concerns or complaints.  9:05 AM exam and HPI c/w very mild left sublingual sialoadenitis without concerning  signs or symptoms.  There is no angioedema/airway involvement, no infectious signs or symptoms, no Ludwick's.  Patient is well-appearing, normal phonation.  I discussed with patient treatment for this is typically home treatment with warm compresses, NSAIDs, sour candy.  Recommend follow-up with PMD if not improved in 2-3 days, or sooner if worse.     Objective The following portions of the patient's chart were reviewed in this encounter and updated as appropriate: allergies, medications, problems, medical history, surgical history, and family history.  BP 110/68   Pulse 90   Temp 37.3 C (99.2 F) (Temporal)   Resp 16   Ht 149.9 cm (4' 11)   Wt 55 kg (121 lb 3.2 oz)   LMP  (LMP Unknown)   SpO2 96%   BMI 24.48 kg/m   Physical Exam: Physical Exam Vitals and nursing note reviewed.  Constitutional:      General: She is not in acute distress.    Appearance: Normal appearance. She is not ill-appearing, toxic-appearing or diaphoretic.  HENT:     Head: Normocephalic.     Right Ear: External ear normal. There is impacted cerumen.     Left Ear: External ear normal. There is impacted cerumen.     Ears:     Comments: Right ear was able to remove all of the cerumen bulging and slight erythema noted on TM. Left ear cerumen unable to remove all of it, patient does report that her hearing    Nose: Congestion present.     Right Sinus: No maxillary sinus tenderness or frontal sinus tenderness.     Left Sinus: No maxillary sinus tenderness or frontal sinus tenderness.     Mouth/Throat:     Mouth: Mucous membranes are moist.     Pharynx: Pharyngeal swelling, posterior oropharyngeal erythema and postnasal drip present.  Eyes:     General: No scleral icterus.       Right eye: No discharge.        Left eye: No discharge.     Extraocular Movements: Extraocular movements intact.  Cardiovascular:     Rate and Rhythm: Normal rate and regular rhythm.     Heart sounds: No murmur heard. Pulmonary:      Effort: Pulmonary effort is normal. No respiratory distress.     Breath sounds: Wheezing present. No rhonchi or rales.  Musculoskeletal:     Cervical back: Normal range of motion and neck supple. Tenderness present.     Comments: Decreased ROM bilateral LE  Lymphadenopathy:     Cervical: Cervical adenopathy present.  Skin:    General: Skin is warm.     Findings: No rash.  Neurological:     General: No focal deficit present.     Mental Status: She is alert and oriented to person, place, and time. Mental status is at baseline.     Motor: Weakness and tremor present.     Gait: Gait abnormal (Unsteady).  Psychiatric:        Mood and Affect: Mood is anxious.        Speech: Speech is slurred.        Behavior: Behavior is cooperative.  I personally spent 25 minutes face-to-face and non-face-to-face in the care of this patient, which includes all pre, intra, and post visit time on the date of service.      Lauraine Fus, NP

## 2024-08-12 ENCOUNTER — Inpatient Hospital Stay (HOSPITAL_COMMUNITY): Admitting: Registered Nurse

## 2024-08-12 ENCOUNTER — Inpatient Hospital Stay (HOSPITAL_COMMUNITY)
Admission: EM | Admit: 2024-08-12 | Discharge: 2024-08-22 | DRG: 853 | Disposition: E | Attending: Critical Care Medicine | Admitting: Critical Care Medicine

## 2024-08-12 ENCOUNTER — Inpatient Hospital Stay (HOSPITAL_COMMUNITY)

## 2024-08-12 ENCOUNTER — Encounter (HOSPITAL_COMMUNITY): Admission: EM | Disposition: E | Payer: Self-pay | Source: Home / Self Care | Attending: Internal Medicine

## 2024-08-12 ENCOUNTER — Other Ambulatory Visit (HOSPITAL_COMMUNITY)

## 2024-08-12 ENCOUNTER — Other Ambulatory Visit: Payer: Self-pay

## 2024-08-12 ENCOUNTER — Emergency Department (HOSPITAL_COMMUNITY)

## 2024-08-12 ENCOUNTER — Ambulatory Visit (HOSPITAL_COMMUNITY)

## 2024-08-12 DIAGNOSIS — L0211 Cutaneous abscess of neck: Secondary | ICD-10-CM | POA: Diagnosis present

## 2024-08-12 DIAGNOSIS — E874 Mixed disorder of acid-base balance: Secondary | ICD-10-CM | POA: Diagnosis not present

## 2024-08-12 DIAGNOSIS — G9341 Metabolic encephalopathy: Secondary | ICD-10-CM | POA: Diagnosis present

## 2024-08-12 DIAGNOSIS — E039 Hypothyroidism, unspecified: Secondary | ICD-10-CM | POA: Diagnosis present

## 2024-08-12 DIAGNOSIS — G253 Myoclonus: Secondary | ICD-10-CM | POA: Diagnosis not present

## 2024-08-12 DIAGNOSIS — D72823 Leukemoid reaction: Secondary | ICD-10-CM | POA: Diagnosis not present

## 2024-08-12 DIAGNOSIS — K122 Cellulitis and abscess of mouth: Secondary | ICD-10-CM | POA: Diagnosis present

## 2024-08-12 DIAGNOSIS — Z9981 Dependence on supplemental oxygen: Secondary | ICD-10-CM

## 2024-08-12 DIAGNOSIS — G931 Anoxic brain damage, not elsewhere classified: Secondary | ICD-10-CM | POA: Diagnosis present

## 2024-08-12 DIAGNOSIS — R4182 Altered mental status, unspecified: Secondary | ICD-10-CM

## 2024-08-12 DIAGNOSIS — I10 Essential (primary) hypertension: Secondary | ICD-10-CM

## 2024-08-12 DIAGNOSIS — Z66 Do not resuscitate: Secondary | ICD-10-CM | POA: Diagnosis not present

## 2024-08-12 DIAGNOSIS — I468 Cardiac arrest due to other underlying condition: Secondary | ICD-10-CM | POA: Diagnosis present

## 2024-08-12 DIAGNOSIS — J9622 Acute and chronic respiratory failure with hypercapnia: Secondary | ICD-10-CM | POA: Diagnosis present

## 2024-08-12 DIAGNOSIS — Z515 Encounter for palliative care: Secondary | ICD-10-CM | POA: Diagnosis not present

## 2024-08-12 DIAGNOSIS — I469 Cardiac arrest, cause unspecified: Secondary | ICD-10-CM | POA: Diagnosis present

## 2024-08-12 DIAGNOSIS — J439 Emphysema, unspecified: Secondary | ICD-10-CM | POA: Diagnosis present

## 2024-08-12 DIAGNOSIS — K7200 Acute and subacute hepatic failure without coma: Secondary | ICD-10-CM | POA: Diagnosis present

## 2024-08-12 DIAGNOSIS — J449 Chronic obstructive pulmonary disease, unspecified: Secondary | ICD-10-CM

## 2024-08-12 DIAGNOSIS — Z810 Family history of intellectual disabilities: Secondary | ICD-10-CM

## 2024-08-12 DIAGNOSIS — I959 Hypotension, unspecified: Secondary | ICD-10-CM | POA: Diagnosis not present

## 2024-08-12 DIAGNOSIS — B49 Unspecified mycosis: Secondary | ICD-10-CM | POA: Insufficient documentation

## 2024-08-12 DIAGNOSIS — N1832 Chronic kidney disease, stage 3b: Secondary | ICD-10-CM | POA: Diagnosis present

## 2024-08-12 DIAGNOSIS — R6521 Severe sepsis with septic shock: Secondary | ICD-10-CM | POA: Diagnosis present

## 2024-08-12 DIAGNOSIS — N170 Acute kidney failure with tubular necrosis: Secondary | ICD-10-CM | POA: Diagnosis present

## 2024-08-12 DIAGNOSIS — B377 Candidal sepsis: Secondary | ICD-10-CM | POA: Diagnosis present

## 2024-08-12 DIAGNOSIS — Z79890 Hormone replacement therapy (postmenopausal): Secondary | ICD-10-CM

## 2024-08-12 DIAGNOSIS — Z8261 Family history of arthritis: Secondary | ICD-10-CM

## 2024-08-12 DIAGNOSIS — E871 Hypo-osmolality and hyponatremia: Secondary | ICD-10-CM | POA: Diagnosis not present

## 2024-08-12 DIAGNOSIS — Z955 Presence of coronary angioplasty implant and graft: Secondary | ICD-10-CM

## 2024-08-12 DIAGNOSIS — I2510 Atherosclerotic heart disease of native coronary artery without angina pectoris: Secondary | ICD-10-CM | POA: Diagnosis not present

## 2024-08-12 DIAGNOSIS — Z87891 Personal history of nicotine dependence: Secondary | ICD-10-CM

## 2024-08-12 DIAGNOSIS — R918 Other nonspecific abnormal finding of lung field: Secondary | ICD-10-CM | POA: Diagnosis present

## 2024-08-12 DIAGNOSIS — Z8543 Personal history of malignant neoplasm of ovary: Secondary | ICD-10-CM

## 2024-08-12 DIAGNOSIS — A4902 Methicillin resistant Staphylococcus aureus infection, unspecified site: Secondary | ICD-10-CM | POA: Insufficient documentation

## 2024-08-12 DIAGNOSIS — J9621 Acute and chronic respiratory failure with hypoxia: Secondary | ICD-10-CM | POA: Diagnosis present

## 2024-08-12 DIAGNOSIS — R569 Unspecified convulsions: Secondary | ICD-10-CM | POA: Diagnosis not present

## 2024-08-12 DIAGNOSIS — Z7189 Other specified counseling: Secondary | ICD-10-CM

## 2024-08-12 DIAGNOSIS — I129 Hypertensive chronic kidney disease with stage 1 through stage 4 chronic kidney disease, or unspecified chronic kidney disease: Secondary | ICD-10-CM | POA: Diagnosis present

## 2024-08-12 DIAGNOSIS — R7401 Elevation of levels of liver transaminase levels: Secondary | ICD-10-CM | POA: Diagnosis not present

## 2024-08-12 DIAGNOSIS — E785 Hyperlipidemia, unspecified: Secondary | ICD-10-CM | POA: Diagnosis present

## 2024-08-12 DIAGNOSIS — J390 Retropharyngeal and parapharyngeal abscess: Secondary | ICD-10-CM | POA: Diagnosis present

## 2024-08-12 DIAGNOSIS — A419 Sepsis, unspecified organism: Secondary | ICD-10-CM | POA: Diagnosis not present

## 2024-08-12 DIAGNOSIS — G936 Cerebral edema: Secondary | ICD-10-CM | POA: Diagnosis present

## 2024-08-12 DIAGNOSIS — Z880 Allergy status to penicillin: Secondary | ICD-10-CM

## 2024-08-12 DIAGNOSIS — I3139 Other pericardial effusion (noninflammatory): Secondary | ICD-10-CM

## 2024-08-12 DIAGNOSIS — N1831 Chronic kidney disease, stage 3a: Secondary | ICD-10-CM | POA: Diagnosis not present

## 2024-08-12 DIAGNOSIS — Z8673 Personal history of transient ischemic attack (TIA), and cerebral infarction without residual deficits: Secondary | ICD-10-CM

## 2024-08-12 DIAGNOSIS — K1120 Sialoadenitis, unspecified: Secondary | ICD-10-CM | POA: Diagnosis present

## 2024-08-12 DIAGNOSIS — Z811 Family history of alcohol abuse and dependence: Secondary | ICD-10-CM

## 2024-08-12 DIAGNOSIS — A4102 Sepsis due to Methicillin resistant Staphylococcus aureus: Principal | ICD-10-CM | POA: Diagnosis present

## 2024-08-12 DIAGNOSIS — E8721 Acute metabolic acidosis: Secondary | ICD-10-CM | POA: Diagnosis not present

## 2024-08-12 DIAGNOSIS — N179 Acute kidney failure, unspecified: Secondary | ICD-10-CM | POA: Diagnosis not present

## 2024-08-12 DIAGNOSIS — Z818 Family history of other mental and behavioral disorders: Secondary | ICD-10-CM

## 2024-08-12 DIAGNOSIS — Z820 Family history of epilepsy and other diseases of the nervous system: Secondary | ICD-10-CM

## 2024-08-12 DIAGNOSIS — J189 Pneumonia, unspecified organism: Secondary | ICD-10-CM | POA: Diagnosis present

## 2024-08-12 DIAGNOSIS — Z90710 Acquired absence of both cervix and uterus: Secondary | ICD-10-CM

## 2024-08-12 DIAGNOSIS — D6489 Other specified anemias: Secondary | ICD-10-CM | POA: Diagnosis present

## 2024-08-12 HISTORY — PX: IRRIGATION AND DEBRIDEMENT ABSCESS: SHX5252

## 2024-08-12 LAB — POCT I-STAT 7, (LYTES, BLD GAS, ICA,H+H)
Acid-base deficit: 4 mmol/L — ABNORMAL HIGH (ref 0.0–2.0)
Acid-base deficit: 4 mmol/L — ABNORMAL HIGH (ref 0.0–2.0)
Acid-base deficit: 3 mmol/L — ABNORMAL HIGH (ref 0.0–2.0)
Acid-base deficit: 4 mmol/L — ABNORMAL HIGH (ref 0.0–2.0)
Acid-base deficit: 6 mmol/L — ABNORMAL HIGH (ref 0.0–2.0)
Bicarbonate: 27.3 mmol/L (ref 20.0–28.0)
Bicarbonate: 25.4 mmol/L (ref 20.0–28.0)
Bicarbonate: 25.1 mmol/L (ref 20.0–28.0)
Bicarbonate: 22.8 mmol/L (ref 20.0–28.0)
Bicarbonate: 22.1 mmol/L (ref 20.0–28.0)
Calcium, Ion: 0.96 mmol/L — ABNORMAL LOW (ref 1.15–1.40)
Calcium, Ion: 0.99 mmol/L — ABNORMAL LOW (ref 1.15–1.40)
Calcium, Ion: 0.98 mmol/L — ABNORMAL LOW (ref 1.15–1.40)
Calcium, Ion: 0.96 mmol/L — ABNORMAL LOW (ref 1.15–1.40)
Calcium, Ion: 0.91 mmol/L — ABNORMAL LOW (ref 1.15–1.40)
HCT: 35 % — ABNORMAL LOW (ref 36.0–46.0)
HCT: 31 % — ABNORMAL LOW (ref 36.0–46.0)
HCT: 31 % — ABNORMAL LOW (ref 36.0–46.0)
HCT: 31 % — ABNORMAL LOW (ref 36.0–46.0)
HCT: 28 % — ABNORMAL LOW (ref 36.0–46.0)
Hemoglobin: 11.9 g/dL — ABNORMAL LOW (ref 12.0–15.0)
Hemoglobin: 10.5 g/dL — ABNORMAL LOW (ref 12.0–15.0)
Hemoglobin: 10.5 g/dL — ABNORMAL LOW (ref 12.0–15.0)
Hemoglobin: 10.5 g/dL — ABNORMAL LOW (ref 12.0–15.0)
Hemoglobin: 9.5 g/dL — ABNORMAL LOW (ref 12.0–15.0)
O2 Saturation: 98 %
O2 Saturation: 86 %
O2 Saturation: 96 %
O2 Saturation: 92 %
O2 Saturation: 98 %
Patient temperature: 35.8
Patient temperature: 37.2
Patient temperature: 37.5
Patient temperature: 36.8
Patient temperature: 36.5
Potassium: 3.5 mmol/L (ref 3.5–5.1)
Potassium: 3.6 mmol/L (ref 3.5–5.1)
Potassium: 3.6 mmol/L (ref 3.5–5.1)
Potassium: 3.6 mmol/L (ref 3.5–5.1)
Potassium: 3.3 mmol/L — ABNORMAL LOW (ref 3.5–5.1)
Sodium: 134 mmol/L — ABNORMAL LOW (ref 135–145)
Sodium: 133 mmol/L — ABNORMAL LOW (ref 135–145)
Sodium: 136 mmol/L (ref 135–145)
Sodium: 136 mmol/L (ref 135–145)
Sodium: 137 mmol/L (ref 135–145)
TCO2: 30 mmol/L (ref 22–32)
TCO2: 28 mmol/L (ref 22–32)
TCO2: 27 mmol/L (ref 22–32)
TCO2: 24 mmol/L (ref 22–32)
TCO2: 24 mmol/L (ref 22–32)
pCO2 arterial: 84.9 mmHg (ref 32–48)
pCO2 arterial: 72.6 mmHg (ref 32–48)
pCO2 arterial: 61.3 mmHg — ABNORMAL HIGH (ref 32–48)
pCO2 arterial: 50.4 mmHg — ABNORMAL HIGH (ref 32–48)
pCO2 arterial: 58.1 mmHg — ABNORMAL HIGH (ref 32–48)
pH, Arterial: 7.108 — CL (ref 7.35–7.45)
pH, Arterial: 7.153 — CL (ref 7.35–7.45)
pH, Arterial: 7.223 — ABNORMAL LOW (ref 7.35–7.45)
pH, Arterial: 7.262 — ABNORMAL LOW (ref 7.35–7.45)
pH, Arterial: 7.185 — CL (ref 7.35–7.45)
pO2, Arterial: 130 mmHg — ABNORMAL HIGH (ref 83–108)
pO2, Arterial: 69 mmHg — ABNORMAL LOW (ref 83–108)
pO2, Arterial: 100 mmHg (ref 83–108)
pO2, Arterial: 74 mmHg — ABNORMAL LOW (ref 83–108)
pO2, Arterial: 128 mmHg — ABNORMAL HIGH (ref 83–108)

## 2024-08-12 LAB — I-STAT VENOUS BLOOD GAS, ED
Acid-base deficit: 22 mmol/L — ABNORMAL HIGH (ref 0.0–2.0)
Bicarbonate: 13 mmol/L — ABNORMAL LOW (ref 20.0–28.0)
Calcium, Ion: 0.99 mmol/L — ABNORMAL LOW (ref 1.15–1.40)
HCT: 35 % — ABNORMAL LOW (ref 36.0–46.0)
Hemoglobin: 11.9 g/dL — ABNORMAL LOW (ref 12.0–15.0)
O2 Saturation: 65 %
Potassium: 3.9 mmol/L (ref 3.5–5.1)
Sodium: 137 mmol/L (ref 135–145)
TCO2: 15 mmol/L — ABNORMAL LOW (ref 22–32)
pCO2, Ven: 78.4 mmHg (ref 44–60)
pH, Ven: 6.827 — CL (ref 7.25–7.43)
pO2, Ven: 61 mmHg — ABNORMAL HIGH (ref 32–45)

## 2024-08-12 LAB — TROPONIN I (HIGH SENSITIVITY)
Troponin I (High Sensitivity): 242 ng/L (ref <18)
Troponin I (High Sensitivity): 236 ng/L (ref <18)

## 2024-08-12 LAB — RAPID URINE DRUG SCREEN, HOSP PERFORMED
Amphetamines: NOT DETECTED
Barbiturates: NOT DETECTED
Benzodiazepines: NOT DETECTED
Cocaine: NOT DETECTED
Opiates: NOT DETECTED
Tetrahydrocannabinol: NOT DETECTED

## 2024-08-12 LAB — URINALYSIS, W/ REFLEX TO CULTURE (INFECTION SUSPECTED)
Bilirubin Urine: NEGATIVE
Glucose, UA: NEGATIVE mg/dL
Ketones, ur: NEGATIVE mg/dL
Leukocytes,Ua: NEGATIVE
Nitrite: NEGATIVE
Protein, ur: 100 mg/dL — AB
Specific Gravity, Urine: 1.021 (ref 1.005–1.030)
pH: 5 (ref 5.0–8.0)

## 2024-08-12 LAB — GLUCOSE, CAPILLARY
Glucose-Capillary: 267 mg/dL — ABNORMAL HIGH (ref 70–99)
Glucose-Capillary: 93 mg/dL (ref 70–99)
Glucose-Capillary: 153 mg/dL — ABNORMAL HIGH (ref 70–99)
Glucose-Capillary: 59 mg/dL — ABNORMAL LOW (ref 70–99)

## 2024-08-12 LAB — CG4 I-STAT (LACTIC ACID): Lactic Acid, Venous: 4.6 mmol/L (ref 0.5–1.9)

## 2024-08-12 LAB — COMPREHENSIVE METABOLIC PANEL WITH GFR
ALT: 234 U/L — ABNORMAL HIGH (ref 0–44)
AST: 419 U/L — ABNORMAL HIGH (ref 15–41)
Albumin: 2 g/dL — ABNORMAL LOW (ref 3.5–5.0)
Alkaline Phosphatase: 116 U/L (ref 38–126)
Anion gap: 26 — ABNORMAL HIGH (ref 5–15)
BUN: 74 mg/dL — ABNORMAL HIGH (ref 8–23)
CO2: 13 mmol/L — ABNORMAL LOW (ref 22–32)
Calcium: 8.2 mg/dL — ABNORMAL LOW (ref 8.9–10.3)
Chloride: 102 mmol/L (ref 98–111)
Creatinine, Ser: 3.61 mg/dL — ABNORMAL HIGH (ref 0.44–1.00)
GFR, Estimated: 13 mL/min — ABNORMAL LOW (ref >60)
Glucose, Bld: 56 mg/dL — ABNORMAL LOW (ref 70–99)
Potassium: 4.5 mmol/L (ref 3.5–5.1)
Sodium: 141 mmol/L (ref 135–145)
Total Bilirubin: 0.7 mg/dL (ref 0.0–1.2)
Total Protein: 4.7 g/dL — ABNORMAL LOW (ref 6.5–8.1)

## 2024-08-12 LAB — I-STAT CHEM 8, ED
BUN: 80 mg/dL — ABNORMAL HIGH (ref 8–23)
Calcium, Ion: 0.98 mmol/L — ABNORMAL LOW (ref 1.15–1.40)
Chloride: 104 mmol/L (ref 98–111)
Creatinine, Ser: 3.1 mg/dL — ABNORMAL HIGH (ref 0.44–1.00)
Glucose, Bld: 52 mg/dL — ABNORMAL LOW (ref 70–99)
HCT: 36 % (ref 36.0–46.0)
Hemoglobin: 12.2 g/dL (ref 12.0–15.0)
Potassium: 3.9 mmol/L (ref 3.5–5.1)
Sodium: 137 mmol/L (ref 135–145)
TCO2: 16 mmol/L — ABNORMAL LOW (ref 22–32)

## 2024-08-12 LAB — I-STAT CG4 LACTIC ACID, ED
Lactic Acid, Venous: 12.6 mmol/L (ref 0.5–1.9)
Lactic Acid, Venous: 6.9 mmol/L (ref 0.5–1.9)

## 2024-08-12 LAB — ECHOCARDIOGRAM COMPLETE
Area-P 1/2: 3.77 cm2
Height: 57 in
S' Lateral: 2.7 cm
Weight: 1481.49 [oz_av]

## 2024-08-12 LAB — PROTIME-INR
INR: 1.6 — ABNORMAL HIGH (ref 0.8–1.2)
Prothrombin Time: 20.2 s — ABNORMAL HIGH (ref 11.4–15.2)

## 2024-08-12 LAB — I-STAT ARTERIAL BLOOD GAS, ED
Acid-base deficit: 20 mmol/L — ABNORMAL HIGH (ref 0.0–2.0)
Acid-base deficit: 1 mmol/L (ref 0.0–2.0)
Bicarbonate: 14.8 mmol/L — ABNORMAL LOW (ref 20.0–28.0)
Bicarbonate: 29.9 mmol/L — ABNORMAL HIGH (ref 20.0–28.0)
Calcium, Ion: 1.04 mmol/L — ABNORMAL LOW (ref 1.15–1.40)
Calcium, Ion: 0.99 mmol/L — ABNORMAL LOW (ref 1.15–1.40)
HCT: 37 % (ref 36.0–46.0)
HCT: 30 % — ABNORMAL LOW (ref 36.0–46.0)
Hemoglobin: 12.6 g/dL (ref 12.0–15.0)
Hemoglobin: 10.2 g/dL — ABNORMAL LOW (ref 12.0–15.0)
O2 Saturation: 90 %
O2 Saturation: 95 %
Patient temperature: 96
Patient temperature: 94.9
Potassium: 3.6 mmol/L (ref 3.5–5.1)
Potassium: 3.8 mmol/L (ref 3.5–5.1)
Sodium: 137 mmol/L (ref 135–145)
Sodium: 139 mmol/L (ref 135–145)
TCO2: 17 mmol/L — ABNORMAL LOW (ref 22–32)
TCO2: 33 mmol/L — ABNORMAL HIGH (ref 22–32)
pCO2 arterial: 78.9 mmHg (ref 32–48)
pCO2 arterial: 82.3 mmHg (ref 32–48)
pH, Arterial: 6.87 — CL (ref 7.35–7.45)
pH, Arterial: 7.156 — CL (ref 7.35–7.45)
pO2, Arterial: 95 mmHg (ref 83–108)
pO2, Arterial: 89 mmHg (ref 83–108)

## 2024-08-12 LAB — TYPE AND SCREEN
ABO/RH(D): A POS
Antibody Screen: NEGATIVE

## 2024-08-12 LAB — CBC
HCT: 38.1 % (ref 36.0–46.0)
Hemoglobin: 11.7 g/dL — ABNORMAL LOW (ref 12.0–15.0)
MCH: 32.9 pg (ref 26.0–34.0)
MCHC: 30.7 g/dL (ref 30.0–36.0)
MCV: 107 fL — ABNORMAL HIGH (ref 80.0–100.0)
Platelets: 380 K/uL (ref 150–400)
RBC: 3.56 MIL/uL — ABNORMAL LOW (ref 3.87–5.11)
RDW: 12.4 % (ref 11.5–15.5)
WBC: 47.7 K/uL — ABNORMAL HIGH (ref 4.0–10.5)
nRBC: 1.2 % — ABNORMAL HIGH (ref 0.0–0.2)

## 2024-08-12 LAB — ABO/RH: ABO/RH(D): A POS

## 2024-08-12 LAB — BASIC METABOLIC PANEL WITH GFR
Anion gap: 22 — ABNORMAL HIGH (ref 5–15)
BUN: 73 mg/dL — ABNORMAL HIGH (ref 8–23)
CO2: 21 mmol/L — ABNORMAL LOW (ref 22–32)
Calcium: 6.9 mg/dL — ABNORMAL LOW (ref 8.9–10.3)
Chloride: 99 mmol/L (ref 98–111)
Creatinine, Ser: 3.25 mg/dL — ABNORMAL HIGH (ref 0.44–1.00)
GFR, Estimated: 15 mL/min — ABNORMAL LOW (ref >60)
Glucose, Bld: 196 mg/dL — ABNORMAL HIGH (ref 70–99)
Potassium: 4 mmol/L (ref 3.5–5.1)
Sodium: 142 mmol/L (ref 135–145)

## 2024-08-12 LAB — TRIGLYCERIDES: Triglycerides: 200 mg/dL — ABNORMAL HIGH (ref <150)

## 2024-08-12 LAB — APTT: aPTT: 53 s — ABNORMAL HIGH (ref 24–36)

## 2024-08-12 LAB — HEMOGLOBIN A1C
Hgb A1c MFr Bld: 5.8 % — ABNORMAL HIGH (ref 4.8–5.6)
Mean Plasma Glucose: 119.76 mg/dL

## 2024-08-12 LAB — MRSA NEXT GEN BY PCR, NASAL: MRSA by PCR Next Gen: DETECTED — AB

## 2024-08-12 LAB — D-DIMER, QUANTITATIVE: D-Dimer, Quant: 13.82 ug{FEU}/mL — ABNORMAL HIGH (ref 0.00–0.50)

## 2024-08-12 LAB — AMYLASE: Amylase: 28 U/L (ref 28–100)

## 2024-08-12 LAB — CBG MONITORING, ED
Glucose-Capillary: 224 mg/dL — ABNORMAL HIGH (ref 70–99)
Glucose-Capillary: 212 mg/dL — ABNORMAL HIGH (ref 70–99)

## 2024-08-12 LAB — MAGNESIUM: Magnesium: 3.2 mg/dL — ABNORMAL HIGH (ref 1.7–2.4)

## 2024-08-12 LAB — PHOSPHORUS: Phosphorus: 11.2 mg/dL — ABNORMAL HIGH (ref 2.5–4.6)

## 2024-08-12 LAB — LIPASE, BLOOD: Lipase: 21 U/L (ref 11–51)

## 2024-08-12 LAB — TSH: TSH: 5.118 u[IU]/mL — ABNORMAL HIGH (ref 0.350–4.500)

## 2024-08-12 SURGERY — IRRIGATION AND DEBRIDEMENT ABSCESS
Anesthesia: General | Site: Neck | Laterality: Bilateral

## 2024-08-12 MED ORDER — PROPOFOL 10 MG/ML IV BOLUS
INTRAVENOUS | Status: AC
  Filled 2024-08-12: qty 20

## 2024-08-12 MED ORDER — ACETAMINOPHEN 650 MG RE SUPP
650.0000 mg | RECTAL | Status: DC | PRN
  Administered 2024-08-12: 650 mg via RECTAL
  Filled 2024-08-12: qty 1

## 2024-08-12 MED ORDER — NOREPINEPHRINE 4 MG/250ML-% IV SOLN
0.0000 ug/min | INTRAVENOUS | Status: DC
  Administered 2024-08-12: 21 ug/min via INTRAVENOUS
  Filled 2024-08-12: qty 500

## 2024-08-12 MED ORDER — LACTATED RINGERS IV SOLN: INTRAVENOUS | Status: DC | PRN

## 2024-08-12 MED ORDER — FAMOTIDINE 20 MG PO TABS
10.0000 mg | ORAL_TABLET | Freq: Every day | ORAL | Status: DC
  Administered 2024-08-12 – 2024-08-15 (×4): 10 mg
  Filled 2024-08-12 (×4): qty 1

## 2024-08-12 MED ORDER — SODIUM BICARBONATE 8.4 % IV SOLN
INTRAVENOUS | Status: AC
  Filled 2024-08-12: qty 100

## 2024-08-12 MED ORDER — VASOPRESSIN 20 UNIT/ML IV SOLN
INTRAVENOUS | Status: AC
  Filled 2024-08-12: qty 1

## 2024-08-12 MED ORDER — ALBUMIN HUMAN 5 % IV SOLN: INTRAVENOUS | Status: DC | PRN

## 2024-08-12 MED ORDER — DEXTROSE 5 % IV SOLN: INTRAVENOUS | Status: DC

## 2024-08-12 MED ORDER — SODIUM CHLORIDE 0.9 % IV SOLN
500.0000 mg | Freq: Once | INTRAVENOUS | Status: AC
  Administered 2024-08-12: 500 mg via INTRAVENOUS
  Filled 2024-08-12: qty 5

## 2024-08-12 MED ORDER — LEVETIRACETAM (KEPPRA) 500 MG/5 ML ADULT IV PUSH: 500.0000 mg | INTRAVENOUS | Status: DC

## 2024-08-12 MED ORDER — DEXTROSE 50 % IV SOLN
12.5000 g | INTRAVENOUS | Status: AC
Start: 2024-08-13 — End: 2024-08-12
  Administered 2024-08-12: 12.5 g via INTRAVENOUS

## 2024-08-12 MED ORDER — DEXTROSE 50 % IV SOLN
INTRAVENOUS | Status: AC
  Filled 2024-08-12: qty 50

## 2024-08-12 MED ORDER — SODIUM CHLORIDE 0.9 % IV SOLN
250.0000 mL | INTRAVENOUS | Status: AC
  Administered 2024-08-12: 250 mL via INTRAVENOUS

## 2024-08-12 MED ORDER — MUPIROCIN 2 % EX OINT
1.0000 | TOPICAL_OINTMENT | Freq: Two times a day (BID) | CUTANEOUS | Status: DC
  Administered 2024-08-12 – 2024-08-15 (×7): 1 via NASAL
  Filled 2024-08-12 (×2): qty 22

## 2024-08-12 MED ORDER — ORAL CARE MOUTH RINSE
15.0000 mL | OROMUCOSAL | Status: DC
Start: 2024-08-12 — End: 2024-08-15
  Administered 2024-08-12 – 2024-08-15 (×32): 15 mL via OROMUCOSAL

## 2024-08-12 MED ORDER — CHLORHEXIDINE GLUCONATE CLOTH 2 % EX PADS
6.0000 | MEDICATED_PAD | Freq: Every day | CUTANEOUS | Status: DC
  Administered 2024-08-13 – 2024-08-15 (×3): 6 via TOPICAL

## 2024-08-12 MED ORDER — SODIUM BICARBONATE 8.4 % IV SOLN
50.0000 meq | Freq: Once | INTRAVENOUS | Status: AC
  Administered 2024-08-12: 50 meq via INTRAVENOUS
  Filled 2024-08-12: qty 50

## 2024-08-12 MED ORDER — IOHEXOL 350 MG/ML SOLN
75.0000 mL | Freq: Once | INTRAVENOUS | Status: AC | PRN
  Administered 2024-08-12: 75 mL via INTRAVENOUS

## 2024-08-12 MED ORDER — NOREPINEPHRINE 4 MG/250ML-% IV SOLN
5.0000 ug/min | INTRAVENOUS | Status: DC
  Administered 2024-08-12: 5 ug/min via INTRAVENOUS

## 2024-08-12 MED ORDER — VASOPRESSIN 20 UNITS/100 ML INFUSION FOR SHOCK
0.0000 [IU]/min | INTRAVENOUS | Status: DC
  Filled 2024-08-12: qty 100

## 2024-08-12 MED ORDER — NOREPINEPHRINE 4 MG/250ML-% IV SOLN: 0.0000 ug/min | INTRAVENOUS | Status: DC

## 2024-08-12 MED ORDER — MIDAZOLAM HCL 2 MG/2ML IJ SOLN
2.0000 mg | INTRAMUSCULAR | Status: DC | PRN
  Administered 2024-08-12 (×2): 2 mg via INTRAVENOUS
  Filled 2024-08-12 (×3): qty 2

## 2024-08-12 MED ORDER — VANCOMYCIN VARIABLE DOSE PER UNSTABLE RENAL FUNCTION (PHARMACIST DOSING): Status: DC

## 2024-08-12 MED ORDER — LACTATED RINGERS IV BOLUS
1000.0000 mL | Freq: Once | INTRAVENOUS | Status: AC
  Administered 2024-08-12: 1000 mL via INTRAVENOUS

## 2024-08-12 MED ORDER — SODIUM CHLORIDE 0.9 % IV SOLN
3.0000 g | Freq: Two times a day (BID) | INTRAVENOUS | Status: DC
  Administered 2024-08-12 – 2024-08-13 (×2): 3 g via INTRAVENOUS
  Filled 2024-08-12 (×2): qty 8

## 2024-08-12 MED ORDER — POLYETHYLENE GLYCOL 3350 17 G PO PACK: 17.0000 g | PACK | Freq: Every day | ORAL | Status: DC | PRN

## 2024-08-12 MED ORDER — LIDOCAINE-EPINEPHRINE 1 %-1:100000 IJ SOLN
INTRAMUSCULAR | Status: AC
  Filled 2024-08-12: qty 1

## 2024-08-12 MED ORDER — DEXTROSE 10 % IV BOLUS
250.0000 mL | Freq: Once | INTRAVENOUS | Status: AC
  Administered 2024-08-12: 250 mL via INTRAVENOUS

## 2024-08-12 MED ORDER — SODIUM CHLORIDE 0.9 % IV SOLN
2.0000 g | Freq: Once | INTRAVENOUS | Status: AC
  Administered 2024-08-12: 2 g via INTRAVENOUS
  Filled 2024-08-12: qty 12.5

## 2024-08-12 MED ORDER — PHENYLEPHRINE 80 MCG/ML (10ML) SYRINGE FOR IV PUSH (FOR BLOOD PRESSURE SUPPORT)
PREFILLED_SYRINGE | INTRAVENOUS | Status: AC | PRN
  Administered 2024-08-12 (×2): 160 ug via INTRAVENOUS

## 2024-08-12 MED ORDER — HYDROCORTISONE SOD SUC (PF) 100 MG IJ SOLR
100.0000 mg | Freq: Two times a day (BID) | INTRAMUSCULAR | Status: DC
  Administered 2024-08-12 – 2024-08-13 (×2): 100 mg via INTRAVENOUS
  Filled 2024-08-12 (×2): qty 2

## 2024-08-12 MED ORDER — DEXTROSE 50 % IV SOLN
1.0000 | Freq: Once | INTRAVENOUS | Status: AC
  Administered 2024-08-12: 50 mL via INTRAVENOUS

## 2024-08-12 MED ORDER — NOREPINEPHRINE 16 MG/250ML-% IV SOLN
0.0000 ug/min | INTRAVENOUS | Status: DC
  Administered 2024-08-12: 6 ug/min via INTRAVENOUS
  Filled 2024-08-12: qty 250

## 2024-08-12 MED ORDER — LIDOCAINE-EPINEPHRINE 1 %-1:100000 IJ SOLN
INTRAMUSCULAR | Status: DC | PRN
  Administered 2024-08-12: 10 mL

## 2024-08-12 MED ORDER — ORAL CARE MOUTH RINSE: 15.0000 mL | OROMUCOSAL | Status: DC | PRN

## 2024-08-12 MED ORDER — LACTATED RINGERS IV SOLN: INTRAVENOUS | Status: DC

## 2024-08-12 MED ORDER — LACTATED RINGERS IV BOLUS: 1000.0000 mL | Freq: Once | INTRAVENOUS | Status: DC

## 2024-08-12 MED ORDER — ROCURONIUM BROMIDE 10 MG/ML (PF) SYRINGE
PREFILLED_SYRINGE | INTRAVENOUS | Status: DC | PRN
  Administered 2024-08-12: 100 mg via INTRAVENOUS

## 2024-08-12 MED ORDER — VANCOMYCIN HCL 1250 MG/250ML IV SOLN
1250.0000 mg | Freq: Once | INTRAVENOUS | Status: AC
  Administered 2024-08-12: 1250 mg via INTRAVENOUS
  Filled 2024-08-12: qty 250

## 2024-08-12 MED ORDER — DOCUSATE SODIUM 50 MG/5ML PO LIQD: 100.0000 mg | Freq: Two times a day (BID) | ORAL | Status: DC | PRN

## 2024-08-12 MED ORDER — IPRATROPIUM-ALBUTEROL 0.5-2.5 (3) MG/3ML IN SOLN
3.0000 mL | RESPIRATORY_TRACT | Status: DC | PRN
  Administered 2024-08-12: 3 mL via RESPIRATORY_TRACT

## 2024-08-12 MED ORDER — SODIUM CHLORIDE 0.9 % IV SOLN: 3.0000 g | INTRAVENOUS | Status: DC

## 2024-08-12 MED ORDER — SODIUM CHLORIDE 0.9 % IV SOLN: 3.0000 g | Freq: Two times a day (BID) | INTRAVENOUS | Status: DC

## 2024-08-12 MED ORDER — SODIUM BICARBONATE 8.4 % IV SOLN
100.0000 meq | Freq: Once | INTRAVENOUS | Status: AC
  Administered 2024-08-12: 100 meq via INTRAVENOUS

## 2024-08-12 MED ORDER — LEVETIRACETAM (KEPPRA) 500 MG/5 ML ADULT IV PUSH
500.0000 mg | Freq: Two times a day (BID) | INTRAVENOUS | Status: DC
  Administered 2024-08-12: 500 mg via INTRAVENOUS
  Filled 2024-08-12: qty 5

## 2024-08-12 MED ORDER — PROPOFOL 1000 MG/100ML IV EMUL
20.0000 ug/kg/min | INTRAVENOUS | Status: DC
  Administered 2024-08-12: 15 ug/kg/min via INTRAVENOUS
  Administered 2024-08-12: 5 ug/kg/min via INTRAVENOUS
  Administered 2024-08-12: 40 ug/kg/min via INTRAVENOUS
  Administered 2024-08-13 (×2): 30 ug/kg/min via INTRAVENOUS
  Administered 2024-08-14: 10 ug/kg/min via INTRAVENOUS
  Administered 2024-08-15 (×2): 20 ug/kg/min via INTRAVENOUS
  Filled 2024-08-12 (×7): qty 100

## 2024-08-12 SURGICAL SUPPLY — 55 items
ATTRACTOMAT 16X20 MAGNETIC DRP (DRAPES) IMPLANT
BAG COUNTER SPONGE SURGICOUNT (BAG) ×1 IMPLANT
BLADE SURG 15 STRL LF DISP TIS (BLADE) IMPLANT
BNDG GAUZE DERMACEA FLUFF 4 (GAUZE/BANDAGES/DRESSINGS) IMPLANT
CANISTER SUCTION 3000ML PPV (SUCTIONS) IMPLANT
CATH ROBINSON RED A/P 16FR (CATHETERS) IMPLANT
CLEANER TIP ELECTROSURG 2X2 (MISCELLANEOUS) ×1 IMPLANT
CNTNR URN SCR LID CUP LEK RST (MISCELLANEOUS) ×1 IMPLANT
COVER SURGICAL LIGHT HANDLE (MISCELLANEOUS) ×1 IMPLANT
DRAIN PENROSE 0.5X18 (DRAIN) IMPLANT
DRAIN PENROSE 12X.25 LTX STRL (MISCELLANEOUS) IMPLANT
DRAPE HALF SHEET 40X57 (DRAPES) IMPLANT
DRSG EMULSION OIL 3X3 NADH (GAUZE/BANDAGES/DRESSINGS) IMPLANT
ELECT COATED BLADE 2.86 ST (ELECTRODE) ×1 IMPLANT
ELECT NDL TIP 2.8 STRL (NEEDLE) IMPLANT
ELECT NEEDLE TIP 2.8 STRL (NEEDLE) IMPLANT
ELECTRODE REM PT RTRN 9FT ADLT (ELECTROSURGICAL) ×1 IMPLANT
FORCEPS BIPOLAR SPETZLER 8 1.0 (NEUROSURGERY SUPPLIES) IMPLANT
GAUZE 4X4 16PLY ~~LOC~~+RFID DBL (SPONGE) IMPLANT
GAUZE SPONGE 4X4 12PLY STRL (GAUZE/BANDAGES/DRESSINGS) IMPLANT
GAUZE STRETCH 2X75IN STRL (MISCELLANEOUS) IMPLANT
GLOVE BIO SURGEON STRL SZ7.5 (GLOVE) ×1 IMPLANT
GLOVE BIOGEL PI IND STRL 8 (GLOVE) ×1 IMPLANT
GOWN STRL REUS W/ TWL LRG LVL3 (GOWN DISPOSABLE) ×2 IMPLANT
GOWN STRL REUS W/ TWL XL LVL3 (GOWN DISPOSABLE) ×1 IMPLANT
KIT BASIN OR (CUSTOM PROCEDURE TRAY) ×1 IMPLANT
KIT TURNOVER KIT B (KITS) ×1 IMPLANT
MARKER SKIN DUAL TIP RULER LAB (MISCELLANEOUS) ×1 IMPLANT
NDL 25GX 5/8IN NON SAFETY (NEEDLE) IMPLANT
NDL HYPO 25GX1X1/2 BEV (NEEDLE) IMPLANT
NDL PRECISIONGLIDE 27X1.5 (NEEDLE) ×1 IMPLANT
NEEDLE 25GX 5/8IN NON SAFETY (NEEDLE) ×1 IMPLANT
NEEDLE HYPO 25GX1X1/2 BEV (NEEDLE) ×1 IMPLANT
NEEDLE PRECISIONGLIDE 27X1.5 (NEEDLE) ×1 IMPLANT
NS IRRIG 1000ML POUR BTL (IV SOLUTION) ×1 IMPLANT
PAD ARMBOARD POSITIONER FOAM (MISCELLANEOUS) ×2 IMPLANT
PENCIL SMOKE EVACUATOR (MISCELLANEOUS) ×1 IMPLANT
POSITIONER HEAD DONUT 9IN (MISCELLANEOUS) IMPLANT
STAPLER SKIN PROX WIDE 3.9 (STAPLE) IMPLANT
SUT CHROMIC 4 0 P 3 18 (SUTURE) ×1 IMPLANT
SUT ETHILON 4 0 PS 2 18 (SUTURE) IMPLANT
SUT NYLON ETHILON 5-0 P-3 1X18 (SUTURE) ×1 IMPLANT
SUT SILK 3 0 REEL (SUTURE) ×1 IMPLANT
SUT SILK 4-0 18XBRD TIE 12 (SUTURE) ×1 IMPLANT
SUT VIC AB 3-0 SH 27X BRD (SUTURE) IMPLANT
SUT VIC AB 3-0 SH 8-18 (SUTURE) IMPLANT
SWAB COLLECTION DEVICE MRSA (MISCELLANEOUS) IMPLANT
SWAB CULTURE ESWAB REG 1ML (MISCELLANEOUS) IMPLANT
SYR BULB IRRIG 60ML STRL (SYRINGE) IMPLANT
SYR TB 1ML LUER SLIP (SYRINGE) IMPLANT
SYRINGE TOOMEY IRRIG 70ML (MISCELLANEOUS) ×1 IMPLANT
TOWEL GREEN STERILE FF (TOWEL DISPOSABLE) ×1 IMPLANT
TRAY ENT MC OR (CUSTOM PROCEDURE TRAY) ×1 IMPLANT
WATER STERILE IRR 1000ML POUR (IV SOLUTION) ×1 IMPLANT
YANKAUER SUCT BULB TIP NO VENT (SUCTIONS) IMPLANT

## 2024-08-12 NOTE — Progress Notes (Signed)
  Echocardiogram 2D Echocardiogram has been performed.  Kelly Chambers 08/12/2024, 2:15 PM

## 2024-08-12 NOTE — ED Triage Notes (Signed)
 Pt BIB [facility] as post CPR. Witnessed arrest when walking to the restroom by husband, he called 911 then started compressions, downtime till EMS arrival was 5 minutes. EMS started CPR at 0904 & ROSC was achieved at 0917. She received a total pf 2 Epi & was asystole & PEA for EMS during resuscitation. A total of 18 minutes CPR before arrival to ED. Arrived with [facility] airway, initial BP was 78/50, Epi gtt started & was then 118/67. Also received Sodium Bicarb & approx. 200cc NS via IO. Husband reports to EMS recently she has had swelling in her neck that she had been back & forth to the hospital &/or UC for & does not know what was causing it. Hx of COPD & has not been wearing the home O2 that is supposed to (per EMS)

## 2024-08-12 NOTE — H&P (Addendum)
 NAME:  Kelly Chambers, MRN:  [id number], DOB:  11/06/1951, LOS: 0 ADMISSION DATE:  08/12/2024, CONSULTATION DATE: 08/12/2024 REFERRING MD: Emergency department physician, CHIEF COMPLAINT: Status post PEA arrest  History of Present Illness:  73 year old female with a extensive past medical history O2 dependent but does not wear her oxygen  she had a witnessed arrest was found to be in PEA required approximately 7 minutes of CPR for return of spontaneous circulation's.  She is now intubated and is respiratory rates been increased to 30 due to PCO2 of 84 and a pH of 6.89.  Chest x-ray is pending at this time.  CT of the head is pending her pupils are fixed and dilated at 5 mm.  Pulmonary critical care has been called to evaluate and admit to the intensive care unit.  Pertinent  Medical History   Past Medical History:  Diagnosis Date   Allergy    hay fever   Anemia    Anxiety    Blood transfusion without reported diagnosis    c section   Cataract    no surgery   Clotting disorder (HCC)    COPD (chronic obstructive pulmonary disease) (HCC)    Depression    Emphysema of lung (HCC)    GERD (gastroesophageal reflux disease)    Hyperlipidemia    Hypertension    Orthostatic hypotension    Osteoarthritis    back hips shoulders and hands   Ovarian cancer (HCC)    Renal failure, chronic, stage 3 (moderate) (HCC) 06/26/2017   Stroke (HCC)    Syncope and collapse    Thyroid  disease      Significant Hospital Events: Including procedures, antibiotic start and stop dates in addition to other pertinent events     Interim History / Subjective:  Witnessed arrest noted to be PEA  Objective    Blood pressure (!) 95/55, pulse 82, resp. rate (!) 22, height 4' 9 (1.448 m), weight 55 kg, SpO2 91%.    Vent Mode: PRVC FiO2 (%):  [100 %] 100 % Set Rate:  [26 bmp-30 bmp] 30 bmp Vt Set:  [310 mL] 310 mL PEEP:  [5 cmH20] 5 cmH20 Plateau Pressure:  [21 cmH20] 21 cmH20  No intake or output data in  the 24 hours ending 08/12/24 1112 Filed Weights   08/12/24 1017  Weight: 55 kg    Examination: General: Unresponsive female with full mechanical ventilatory support HENT: Endotracheal tube gastric tube are in place.  Pupils are nonreactive 5 mm negative doll's eyes twitching of lower jaw Lungs: Decreased breath sounds throughout Cardiovascular: Heart sounds are distant Abdomen: Distended positive bowel sounds Extremities: Without edema or obvious signs of trauma Neuro: Nonresponsive CT of the head is pending GU: No urine output with a history of renal insufficiency  Resolved problem list   Assessment and Plan  Sudden loss of consciousness unknown etiology requiring CPR with return of spontaneous circulation after greater than 7 minutes of CPR and EMS been involved.  Was noted to have a dextrose  level of 52 along with elevated creatinine pupils were fixed and dilated on examination she had recently been worked up for a pulmonary nodule without definitive determination and is O2 dependent but has not been wearing her oxygen . Intubated by the emergency department physician Elevated pCO2 treated with increased respiratory rate Bicarb Repeat ABG  Hypotension Peripheral Levophed  Fluid resuscitation Treat hypoglycemia  Altered mental status CT of the head    PNA Unasyn  Pan culture   Hypothyroidism Synthroid  Chronic  renal insufficiency Lab Results  Component Value Date   CREATININE 3.10 (H) 08/12/2024   CREATININE 1.12 (H) 06/19/2021   CREATININE 0.96 09/28/2018   CREATININE 1.20 (H) 06/25/2017   Fluid resuscitation  Best Practice (right click and Reselect all SmartList Selections daily)   Diet/type: NPO DVT prophylaxis prophylactic heparin  Pressure ulcer(s): N/A GI prophylaxis: PPI Lines: N/A Foley:  N/A Code Status:  full code Last date of multidisciplinary goals of care discussion [tbd]  Labs   CBC: Recent Labs  Lab 08/12/24 1012 08/12/24 1030  08/12/24 1031 08/12/24 1034  WBC 47.7*  --   --   --   HGB 11.7* 12.2 11.9* 12.6  HCT 38.1 36.0 35.0* 37.0  MCV 107.0*  --   --   --   PLT 380  --   --   --     Basic Metabolic Panel: Recent Labs  Lab 08/12/24 1030 08/12/24 1031 08/12/24 1034  NA 137 137 137  K 3.9 3.9 3.6  CL 104  --   --   GLUCOSE 52*  --   --   BUN 80*  --   --   CREATININE 3.10*  --   --    GFR: Estimated Creatinine Clearance: 11.9 mL/min (A) (by C-G formula based on SCr of 3.1 mg/dL (H)). Recent Labs  Lab 08/12/24 1012 08/12/24 1026  WBC 47.7*  --   LATICACIDVEN  --  12.6*    Liver Function Tests: No results for input(s): AST, ALT, ALKPHOS, BILITOT, PROT, ALBUMIN  in the last 168 hours. No results for input(s): LIPASE, AMYLASE in the last 168 hours. No results for input(s): AMMONIA in the last 168 hours.  ABG    Component Value Date/Time   PHART 6.870 (LL) 08/12/2024 1034   PCO2ART 78.9 (HH) 08/12/2024 1034   PO2ART 95 08/12/2024 1034   HCO3 14.8 (L) 08/12/2024 1034   TCO2 17 (L) 08/12/2024 1034   ACIDBASEDEF 20.0 (H) 08/12/2024 1034   O2SAT 90 08/12/2024 1034     Coagulation Profile: No results for input(s): INR, PROTIME in the last 168 hours.  Cardiac Enzymes: No results for input(s): CKTOTAL, CKMB, CKMBINDEX, TROPONINI in the last 168 hours.  HbA1C: No results found for: HGBA1C  CBG: No results for input(s): GLUCAP in the last 168 hours.  Review of Systems:   na  Past Medical History:  She,  has a past medical history of Allergy, Anemia, Anxiety, Blood transfusion without reported diagnosis, Cataract, Clotting disorder (HCC), COPD (chronic obstructive pulmonary disease) (HCC), Depression, Emphysema of lung (HCC), GERD (gastroesophageal reflux disease), Hyperlipidemia, Hypertension, Orthostatic hypotension, Osteoarthritis, Ovarian cancer (HCC), Renal failure, chronic, stage 3 (moderate) (HCC) (06/26/2017), Stroke (HCC), Syncope and collapse, and  Thyroid  disease.   Surgical History:   Past Surgical History:  Procedure Laterality Date   ABDOMINAL HYSTERECTOMY     uncertain   CARDIAC CATHETERIZATION     1 stent   CESAREAN SECTION     x 3   CHOLECYSTECTOMY     CORONARY ANGIOPLASTY WITH STENT PLACEMENT     OOPHORECTOMY     TRIGEMINAL NERVE DECOMPRESSION Right 2016     Social History:   reports that she quit smoking about 3 years ago. Her smoking use included e-cigarettes and cigarettes. She started smoking about 57 years ago. She has a 108 pack-year smoking history. She has quit using smokeless tobacco. She reports that she does not currently use alcohol. She reports that she does not use drugs.   Family  History:  Her family history includes Alcohol abuse in her father, mother, and sister; Alzheimer's disease in her sister; Arthritis in her father and mother; Cancer in her father, maternal grandfather, and mother; Depression in her sister; Early death (age of onset: 78) in her mother; Mental retardation in her mother and sister; Multiple sclerosis in her sister.   Allergies Allergies  Allergen Reactions   Penicillins Itching    Skin itching     Home Medications  Prior to Admission medications   Medication Sig Start Date End Date Taking? Authorizing Provider  albuterol  (PROVENTIL  HFA;VENTOLIN  HFA) 108 (90 Base) MCG/ACT inhaler Inhale into the lungs. 08/21/15   [provider]  albuterol  (PROVENTIL ) (2.5 MG/3ML) 0.083% nebulizer solution Take 3 mLs (2.5 mg total) by nebulization every 4 (four) hours as needed for wheezing or shortness of breath. 05/08/21   Darlean Ozell NOVAK, MD  amLODipine (NORVASC) 2.5 MG tablet Take 1 tablet by mouth daily. 09/18/22   [provider]  aspirin 81 MG chewable tablet Chew by mouth.    [provider]  atorvastatin (LIPITOR) 20 MG tablet Take 20 mg by mouth daily. 09/18/22   [provider]  gabapentin (NEURONTIN) 300 MG capsule Take 300 mg by mouth 3 (three) times  daily. 09/18/22   [provider]  levothyroxine (SYNTHROID, LEVOTHROID) 75 MCG tablet Take by mouth.    [provider]  nitroGLYCERIN (NITROSTAT) 0.4 MG SL tablet Place under the tongue.    [provider]  predniSONE  (DELTASONE ) 10 MG tablet 2 daily until better then one daily 04/08/21   Wert, Michael B, MD  Tiotropium Bromide -Olodaterol (STIOLTO RESPIMAT ) 2.5-2.5 MCG/ACT AERS Inhale 2 puffs into the lungs daily. 06/19/21   Darlean Ozell NOVAK, MD  traZODone  (DESYREL ) 100 MG tablet TAKE 1 TABLET BY MOUTH AT BEDTIME 08/21/20   Okey Barnie SAUNDERS, MD     Critical care time: 45 min    Marcey Johnae Friley ACNP Acute Care Nurse Practitioner [facility] Please consult Amion 08/12/2024, 11:12 AM

## 2024-08-12 NOTE — ED Provider Notes (Addendum)
 [place] EMERGENCY DEPARTMENT AT [facility] Provider Note  MDM   HPI/ROS:  Kelly Chambers is a 73 y.o. female with a medical history as below who presents with concern for cardiac arrest.  Patient unresponsive on arrival to us  with history obtained via EMS.  Patient has been seen for jaw infection recently and was found after approximately 5 minutes of downtime by her husband.  Received bystander CPR at that time.  EMS arrived 7 minutes after the call and perform CPR with 2 rounds of epi prior to ROSC obtained.  Patient has a history of COPD.   Physical exam is notable for: - Obtunded, dilated minimally reactive pupils.  LMA in place with good breath sounds bilaterally.  Right-sided submandibular swelling and erythema  On my initial evaluation, patient is:  -Hypotensive, hypothermic, ill-appearing -Additional history obtained from EMS  Differentials include cardiac arrest, PE, dissection, respiratory arrest, sepsis with shock, hemorrhagic or ischemic CVA  On arrival patient has good pulses, found to be hypotensive.  Immediate EKG without STEMI.  Did exchange LMA for ETT with good aeration bilaterally.  She had to require 4 total cc of phenylephrine  to improve blood pressure prior to intubation.  She has good compliance and has no obvious wheezing.  Due to persistent hypotension Levophed  was initiated to maintain maps greater than 65.  She never made any purposeful movements while under my care.  Given her blown pupils and extreme lactic acidosis I do believe the patient likely has poor prognosis.  Uncertain of the exact etiology of her cardiac arrest at this point.  Sepsis remains on the differential, broad-spectrum antibiotics were initiated.  Initially very acidemic with very high lactic as well as hypercapnic.  Respiratory arrest also remains on the differential.  Did make some ventilatory changes and give additional bicarb.  Significant elevation of dimer, CT PE ordered.   Postintubation CXR with ET tube extending to the carina, retracted 2 cm.  Repeat CXR with appropriate positioning she will require admission to the ICU for further workup of her cardiac arrest.  Handoff was given to to their team and all questions answered.  Also reached out to cardiology given cardiac arrest, do not plan on acute intervention at this time.  Admitted to the ICU in critical condition  Interpretations, interventions, and the patient's course of care are documented below.    Clinical Course as of 08/12/24 1237  Fri Aug 12, 2024  1035 Lactic Acid, Venous(!!): 12.6 [RC]  1035 pH, Ven(!!): 6.827 [RC]  1036 pH, Ven(!!): 6.827 Bicarb given  [RC]  1037 pCO2, Ven(!!): 78.4 Increase rate [RC]  1235 Troponin I (High Sensitivity)(!!): 242 [RC]  1235 D-Dimer, Quant(!): 13.82 CT PE ordered [RC]   Postintubation CXR with ET tube extending to the carina, retracted 2 cm.  Repeat CXR with appropriate positioning    Clinical Course User Index [RC] Sharyne Darina RAMAN, MD      Disposition:  I discussed the case with intensivist who graciously agreed to admit the patient to their service for continued care.   Clinical Impression:  1. Cardiac arrest [facility])    The plan for this patient was discussed with Dr. Levander, who voiced agreement and who oversaw evaluation and treatment of this patient.   Clinical Complexity A medically appropriate history, review of systems, and physical exam was performed.  My independent interpretations of EKG, labs, and radiology are documented in the ED course above.   If decision rules were used in this patient's evaluation,  they are listed below.   Click here for ABCD2, HEART and other calculatorsREFRESH Note before signing   Patient's presentation is most consistent with acute presentation with potential threat to life or bodily function.  Medical Decision Making Amount and/or Complexity of Data Reviewed Labs: ordered. Decision-making details documented  in ED Course. Radiology: ordered.  Risk OTC drugs. Prescription drug management. Decision regarding hospitalization.    HPI/ROS      See MDM section for pertinent HPI and ROS. A complete ROS was performed with pertinent positives/negatives noted above.   Past Medical History:  Diagnosis Date   Allergy    hay fever   Anemia    Anxiety    Blood transfusion without reported diagnosis    c section   Cataract    no surgery   Clotting disorder (HCC)    COPD (chronic obstructive pulmonary disease) (HCC)    Depression    Emphysema of lung (HCC)    GERD (gastroesophageal reflux disease)    Hyperlipidemia    Hypertension    Orthostatic hypotension    Osteoarthritis    back hips shoulders and hands   Ovarian cancer (HCC)    Renal failure, chronic, stage 3 (moderate) (HCC) 06/26/2017   Stroke (HCC)    Syncope and collapse    Thyroid  disease     Past Surgical History:  Procedure Laterality Date   ABDOMINAL HYSTERECTOMY     uncertain   CARDIAC CATHETERIZATION     1 stent   CESAREAN SECTION     x 3   CHOLECYSTECTOMY     CORONARY ANGIOPLASTY WITH STENT PLACEMENT     OOPHORECTOMY     TRIGEMINAL NERVE DECOMPRESSION Right 2016      Physical Exam   Vitals:   08/12/24 1141 08/12/24 1145 08/12/24 1151 08/12/24 1154  BP:  (!) 103/40 98/66 (!) 99/56  Pulse: 83 81 82 85  Resp: (!) 30 (!) 30 (!) 28 (!) 26  Temp: (!) 94.9 F (34.9 C) (!) 94.8 F (34.9 C) (!) 94.8 F (34.9 C) (!) 94.7 F (34.8 C)  TempSrc:      SpO2: 98% 97% 97% 98%  Weight:      Height:        Physical Exam Constitutional:      Appearance: She is ill-appearing.     Comments: Obtunded  Eyes:     Pupils:     Right eye: Pupil is not reactive.     Left eye: Pupil is not reactive.     Comments: Pupils dilated bilaterally  Neck:     Comments: Swelling, erythema of the right inframandibular region.  No brawniness Pulmonary:     Breath sounds: No wheezing.     Comments: Coarse breath sounds  bilaterally Neurological:     Mental Status: She is unresponsive.     GCS: GCS eye subscore is 1. GCS verbal subscore is 1. GCS motor subscore is 1.      Procedures   If procedures were preformed on this patient, they are listed below:  Procedure Name: Intubation Date/Time: 08/12/2024 12:40 PM  Performed by: Sharyne Darina RAMAN, MDPre-anesthesia Checklist: Emergency Drugs available and Suction available Oxygen  Delivery Method: Ambu bag Preoxygenation: Pre-oxygenation with 100% oxygen  Induction Type: Rapid sequence Ventilation: Oral airway inserted - appropriate to patient size Laryngoscope Size: Mac and 4 Grade View: Grade I Tube size: 7.5 mm Number of attempts: 1 Airway Equipment and Method: Rigid stylet and Video-laryngoscopy Placement Confirmation: ETT inserted through vocal cords under  direct vision, Positive ETCO2, Breath sounds checked- equal and bilateral and CO2 detector Secured at: 24 cm Tube secured with: ETT holder Dental Injury: Teeth and Oropharynx as per pre-operative assessment        @BBSIG @   Please note that this documentation was produced with the assistance of voice-to-text technology and may contain errors.    Sharyne Darina RAMAN, MD 08/12/24 1242    Sharyne Darina RAMAN, MD 08/12/24 1243    Levander Houston, MD Sep 11, 2024 [phone]

## 2024-08-12 NOTE — ED Notes (Signed)
 Xray called for post intubation imaging.

## 2024-08-12 NOTE — Progress Notes (Signed)
 MB called spoke to Nurse. Pt just arrived from ED then on to CT. Per Nurse, Pt is being received and currently with Providers having procedures and lines placed, not available at this time. Will check back as schedule permits.

## 2024-08-12 NOTE — ED Notes (Signed)
Critical Care at bedside.  

## 2024-08-12 NOTE — Progress Notes (Signed)
 eLink Physician-Brief Progress Note Patient Name: Kelly Chambers DOB: Jan 28, 1951 MRN: [id number]   Date of Service  08/12/2024  HPI/Events of Note  Patient with septic shock due to neck abscesses and pneumonia who received 7 minutes of CPR for cardiac arrest , then following ROSC was taken to the operating room for urgent drainage of neck abscesses, she returned to the ICU intubated and sedated.  eICU Interventions  New Patient Evaluation.        [name] 08/12/2024, 8:59 PM

## 2024-08-12 NOTE — Procedures (Signed)
 Arterial Catheter Insertion Procedure Note  Kelly Chambers  [id number]  [birth year]  Date:08/12/24  Time:2:47 PM    Provider Performing: Valinda Novas    Procedure: Insertion of Arterial Line (63379) with US  guidance (23062)   Indication(s) Blood pressure monitoring and/or need for frequent ABGs  Consent Risks of the procedure as well as the alternatives and risks of each were explained to the patient and/or caregiver.  Consent for the procedure was obtained and is signed in the bedside chart  Anesthesia None   Time Out Verified patient identification, verified procedure, site/side was marked, verified correct patient position, special equipment/implants available, medications/allergies/relevant history reviewed, required imaging and test results available.   Sterile Technique Maximal sterile technique including full sterile barrier drape, hand hygiene, sterile gown, sterile gloves, mask, hair covering, sterile ultrasound probe cover (if used).   Procedure Description Area of catheter insertion was cleaned with chlorhexidine  and draped in sterile fashion. With real-time ultrasound guidance an arterial catheter was placed into the left Axillary artery.  Appropriate arterial tracings confirmed on monitor.     Complications/Tolerance None; patient tolerated the procedure well.   EBL Minimal   Specimen(s) None

## 2024-08-12 NOTE — ED Notes (Signed)
 Pt has a good Rt femoral pulse at this time.

## 2024-08-12 NOTE — Procedures (Signed)
 Central Venous Catheter Insertion Procedure Note  MAROLYN URSCHEL  [id number]  [birth year]  Date:08/12/24  Time:2:20 PM   Provider Performing:Kaelee Pfeffer   Procedure: Insertion of Non-tunneled Central Venous [phone]) with US  guidance (23062)   Indication(s) Medication administration  Consent Risks of the procedure as well as the alternatives and risks of each were explained to the patient and/or caregiver.  Consent for the procedure was obtained and is signed in the bedside chart  Anesthesia Topical only with 1% lidocaine    Timeout Verified patient identification, verified procedure, site/side was marked, verified correct patient position, special equipment/implants available, medications/allergies/relevant history reviewed, required imaging and test results available.  Sterile Technique Maximal sterile technique including full sterile barrier drape, hand hygiene, sterile gown, sterile gloves, mask, hair covering, sterile ultrasound probe cover (if used).  Procedure Description Area of catheter insertion was cleaned with chlorhexidine  and draped in sterile fashion.  With real-time ultrasound guidance a central venous catheter was placed into the left subclavian vein. Nonpulsatile blood flow and easy flushing noted in all ports.  The catheter was sutured in place and sterile dressing applied.  Complications/Tolerance None; patient tolerated the procedure well. Chest X-ray is ordered to verify placement for internal jugular or subclavian cannulation.   Chest x-ray is not ordered for femoral cannulation.  EBL Minimal  Specimen(s) None

## 2024-08-12 NOTE — Progress Notes (Signed)
 Pharmacy Antibiotic Note  Kelly Chambers is a 73 y.o. female admitted on 08/12/2024 with sepsis.  Pharmacy has been consulted for Vancomycin  dosing.  Pt received 1250mg  IV load ~1430  Pt with CKD stage III, est CrCl ~ 10 ml/min.  Plan: Unasyn  3g IV q12h  F/u renal function in a.m. to help guide further Vancomycin  dosing - will likely need random vanc level at 24-36 hrs Will f/u renal function, micro data, and pt's clinical condition   Height: 4' 9 (144.8 cm) Weight: 42 kg (92 lb 9.5 oz) IBW/kg (Calculated) : 38.6  Temp (24hrs), Avg:95.2 F (35.1 C), Min:94.7 F (34.8 C), Max:96.1 F (35.6 C)  Recent Labs  Lab 08/12/24 1012 08/12/24 1014 08/12/24 1026 08/12/24 1030 08/12/24 1210 08/12/24 1212 08/12/24 1514  WBC 47.7*  --   --   --   --   --   --   CREATININE  --  3.61*  --  3.10*  --  3.25*  --   LATICACIDVEN  --   --  12.6*  --  6.9*  --  4.6*    Estimated Creatinine Clearance: 9.7 mL/min (A) (by C-G formula based on SCr of 3.25 mg/dL (H)).    Allergies  Allergen Reactions   Penicillins Itching    Skin itching    Antimicrobials this admission: 8/22 Unasyn  >>  8/22 Vanc >>  8/22 Cefepime  x 1 8/22 Azithromycin  x 1  Microbiology results: 8/22 BCx:  8/22 MRSA PCR: positive  Thank you for allowing pharmacy to be a part of this patient's care.  Vito Ralph, PharmD, BCPS Please see amion for complete clinical pharmacist phone list 08/12/2024 3:40 PM

## 2024-08-12 NOTE — ED Notes (Signed)
 This RN called [facility] to give report & will get a return call.

## 2024-08-12 NOTE — Consult Note (Addendum)
 BRIEF CONSULT NOTE  Called by Medicine. Patient had cardiac arrest, in septic shock. 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  Reviewed imaging. Needs emergent I&D/washout. Clear for OR per medicine.  Add on for 1700 today, urgent surgery  Electronically signed by:  Elspeth Coddington, MD  Staff Physician Facial Plastic & Reconstructive Surgery Otolaryngology - Head and Neck Surgery [facility]   ENT CONSULT:  Reason for Consult: Ludwigs angina  Referring Physician:  Lonna Coder MD  HPI: Kelly Chambers is an 73 y.o. female who presented to the emergency room this morning in cardiac arrest intubated and resuscitated brought to the heart unit.  Workup significant for septic shock with a white count of 48 and a CT of the neck with contrast demonstrating multiloculated floor mouth masticator space, parapharyngeal and retropharyngeal space abscess.  Patient has a history of recent treatment for sialoadenitis at [facility] approximately 5 days ago.  ENT consulted for urgent surgical management.  Patient intubated in ICU.  Husband not at bedside.  Husband called over telephone.   Past Medical History:  Diagnosis Date   Allergy    hay fever   Anemia    Anxiety    Blood transfusion without reported diagnosis    c section   Cataract    no surgery   Clotting disorder (HCC)    COPD (chronic obstructive pulmonary disease) (HCC)    Depression    Emphysema of lung (HCC)    GERD (gastroesophageal reflux disease)    Hyperlipidemia    Hypertension    Orthostatic hypotension    Osteoarthritis    back hips shoulders and hands   Ovarian cancer (HCC)    Renal failure, chronic, stage 3 (moderate) (HCC) 06/26/2017   Stroke (HCC)    Syncope and collapse    Thyroid  disease     Past Surgical History:  Procedure Laterality Date   ABDOMINAL HYSTERECTOMY     uncertain   CARDIAC CATHETERIZATION     1  stent   CESAREAN SECTION     x 3   CHOLECYSTECTOMY     CORONARY ANGIOPLASTY WITH STENT PLACEMENT     OOPHORECTOMY     TRIGEMINAL NERVE DECOMPRESSION Right 2016    Family History  Problem Relation Age of Onset   Alcohol abuse Mother    Arthritis Mother    Mental retardation Mother    Early death Mother 61       per patient, grief   Cancer Mother        lung   Alcohol abuse Father    Cancer Father        everywhere   Arthritis Father    Depression Sister    Alcohol abuse Sister    Multiple sclerosis Sister    Alzheimer's disease Sister    Mental retardation Sister        DOWNS   Cancer Maternal Grandfather        throat    Social History:  reports that she quit smoking about 3 years ago. Her smoking use included e-cigarettes and cigarettes. She started smoking about 57 years ago. She has a 108 pack-year smoking history. She has quit using smokeless tobacco. She reports that she does not currently use alcohol. She reports that she does not use drugs.  Allergies:  Allergies  Allergen Reactions   Penicillins Itching    Skin itching    Medications: I have reviewed the  patient's current medications.  Results for orders placed or performed during the hospital encounter of 08/12/24 (from the past 48 hours)  CBC     Status: Abnormal   Collection Time: 08/12/24 10:12 AM  Result Value Ref Range   WBC 47.7 (H) 4.0 - 10.5 K/uL   RBC 3.56 (L) 3.87 - 5.11 MIL/uL   Hemoglobin 11.7 (L) 12.0 - 15.0 g/dL   HCT 61.8 63.9 - 53.9 %   MCV 107.0 (H) 80.0 - 100.0 fL   MCH 32.9 26.0 - 34.0 pg   MCHC 30.7 30.0 - 36.0 g/dL   RDW 87.5 88.4 - 84.4 %   Platelets 380 150 - 400 K/uL   nRBC 1.2 (H) 0.0 - 0.2 %    Comment: Performed at [facility] Lab, [address] [place], [place] [address]  Troponin I (High Sensitivity)     Status: Abnormal   Collection Time: 08/12/24 10:12 AM  Result Value Ref Range   Troponin I (High Sensitivity) 242 (HH) <18 ng/L    Comment: CRITICAL RESULT  CALLED TO, READ BACK BY AND VERIFIED WITH ZANE RIGHTER, RN 08/12/24 1148 MAULES (NOTE) Elevated high sensitivity troponin I (hsTnI) values and significant  changes across serial measurements may suggest ACS but many other  chronic and acute conditions are known to elevate hsTnI results.  Refer to the Links section for chest pain algorithms and additional  guidance. Performed at [facility], [address] [place], [place] [address]   APTT     Status: Abnormal   Collection Time: 08/12/24 10:12 AM  Result Value Ref Range   aPTT 53 (H) 24 - 36 seconds    Comment:        IF BASELINE aPTT IS ELEVATED, SUGGEST PATIENT RISK ASSESSMENT BE USED TO DETERMINE APPROPRIATE ANTICOAGULANT THERAPY. Performed at [facility] Lab, [address], [place], [place] [address]   Protime-INR     Status: Abnormal   Collection Time: 08/12/24 10:12 AM  Result Value Ref Range   Prothrombin Time 20.2 (H) 11.4 - 15.2 seconds   INR 1.6 (H) 0.8 - 1.2    Comment: (NOTE) INR goal varies based on device and disease states. Performed at [facility] Lab, [address] [place], [place] [address]   Phosphorus     Status: Abnormal   Collection Time: 08/12/24 10:12 AM  Result Value Ref Range   Phosphorus 11.2 (H) 2.5 - 4.6 mg/dL    Comment: Performed at [facility] Lab, [address], [place], [place] [address]  D-dimer, quantitative     Status: Abnormal   Collection Time: 08/12/24 10:12 AM  Result Value Ref Range   D-Dimer, Quant 13.82 (H) 0.00 - 0.50 ug/mL-FEU    Comment: (NOTE) At the manufacturer cut-off value of 0.5 g/mL FEU, this assay has a negative predictive value of 95-100%.This assay is intended for use in conjunction with a clinical pretest probability (PTP) assessment model to exclude pulmonary embolism (PE) and deep venous thrombosis (DVT) in outpatients suspected of PE or DVT. Results should be correlated with clinical presentation. Performed at [facility] Lab,  [address] [place], [place] [address]   Comprehensive metabolic panel     Status: Abnormal   Collection Time: 08/12/24 10:14 AM  Result Value Ref Range   Sodium 141 135 - 145 mmol/L   Potassium 4.5 3.5 - 5.1 mmol/L   Chloride 102 98 - 111 mmol/L   CO2 13 (L) 22 -  32 mmol/L   Glucose, Bld 56 (L) 70 - 99 mg/dL    Comment: Glucose reference range applies only to samples taken after fasting for at least 8 hours.   BUN 74 (H) 8 - 23 mg/dL   Creatinine, Ser 6.38 (H) 0.44 - 1.00 mg/dL   Calcium 8.2 (L) 8.9 - 10.3 mg/dL   Total Protein 4.7 (L) 6.5 - 8.1 g/dL   Albumin  2.0 (L) 3.5 - 5.0 g/dL   AST 580 (H) 15 - 41 U/L   ALT 234 (H) 0 - 44 U/L    Comment: RESULT CONFIRMED BY MANUAL DILUTION   Alkaline Phosphatase 116 38 - 126 U/L   Total Bilirubin 0.7 0.0 - 1.2 mg/dL   GFR, Estimated 13 (L) >60 mL/min    Comment: (NOTE) Calculated using the CKD-EPI Creatinine Equation (2021)    Anion gap 26 (H) 5 - 15    Comment: ELECTROLYTES REPEATED TO VERIFY Performed at [facility] Lab, [address] [place], [place] [address]   Magnesium     Status: Abnormal   Collection Time: 08/12/24 10:14 AM  Result Value Ref Range   Magnesium 3.2 (H) 1.7 - 2.4 mg/dL    Comment: Performed at [facility] Lab, [address] [place], [place] [address]  Rapid urine drug screen (hospital performed)     Status: None   Collection Time: 08/12/24 10:21 AM  Result Value Ref Range   Opiates NONE DETECTED NONE DETECTED   Cocaine NONE DETECTED NONE DETECTED   Benzodiazepines NONE DETECTED NONE DETECTED   Amphetamines NONE DETECTED NONE DETECTED   Tetrahydrocannabinol NONE DETECTED NONE DETECTED   Barbiturates NONE DETECTED NONE DETECTED    Comment: (NOTE) DRUG SCREEN FOR MEDICAL PURPOSES ONLY.  IF CONFIRMATION IS NEEDED FOR ANY PURPOSE, NOTIFY LAB WITHIN 5 DAYS.  LOWEST DETECTABLE LIMITS FOR URINE DRUG SCREEN Drug Class                     Cutoff (ng/mL) Amphetamine and metabolites    1000 Barbiturate  and metabolites    200 Benzodiazepine                 200 Opiates and metabolites        300 Cocaine and metabolites        300 THC                            50 Performed at [facility] Lab, [address], [place], [place] [address]   Triglycerides     Status: Abnormal   Collection Time: 08/12/24 10:21 AM  Result Value Ref Range   Triglycerides 200 (H) <150 mg/dL    Comment: Performed at [facility] Lab, [address], [place], [place] [address]  Urinalysis, w/ Reflex to Culture (Infection Suspected) -Urine, Catheterized     Status: Abnormal   Collection Time: 08/12/24 10:21 AM  Result Value Ref Range   Specimen Source URINE, CATHETERIZED    Color, Urine AMBER (A) YELLOW    Comment: BIOCHEMICALS MAY BE AFFECTED BY COLOR   APPearance CLOUDY (A) CLEAR   Specific Gravity, Urine 1.021 1.005 - 1.030   pH 5.0 5.0 - 8.0   Glucose, UA NEGATIVE NEGATIVE mg/dL   Hgb urine dipstick MODERATE (A) NEGATIVE   Bilirubin Urine NEGATIVE NEGATIVE   Ketones, ur NEGATIVE NEGATIVE mg/dL   Protein, ur 899 (A) NEGATIVE mg/dL   Nitrite NEGATIVE  NEGATIVE   Leukocytes,Ua NEGATIVE NEGATIVE   RBC / HPF 0-5 0 - 5 RBC/hpf   WBC, UA 0-5 0 - 5 WBC/hpf    Comment:        Reflex urine culture not performed if WBC <=10, OR if Squamous epithelial cells >5. If Squamous epithelial cells >5 suggest recollection.    Bacteria, UA RARE (A) NONE SEEN   Squamous Epithelial / HPF 0-5 0 - 5 /HPF   Mucus PRESENT    Hyaline Casts, UA PRESENT    RBC Casts, UA PRESENT     Comment: Performed at [facility] Lab, [address], [place], [place] [address]  I-Stat CG4 Lactic Acid     Status: Abnormal   Collection Time: 08/12/24 10:26 AM  Result Value Ref Range   Lactic Acid, Venous 12.6 (HH) 0.5 - 1.9 mmol/L   Comment NOTIFIED PHYSICIAN   I-stat chem 8, ED (not at [facility], [facility] or [facility])     Status: Abnormal   Collection Time: 08/12/24 10:30 AM  Result Value Ref Range   Sodium 137 135 - 145 mmol/L   Potassium 3.9  3.5 - 5.1 mmol/L   Chloride 104 98 - 111 mmol/L   BUN 80 (H) 8 - 23 mg/dL   Creatinine, Ser 6.89 (H) 0.44 - 1.00 mg/dL   Glucose, Bld 52 (L) 70 - 99 mg/dL    Comment: Glucose reference range applies only to samples taken after fasting for at least 8 hours.   Calcium, Ion 0.98 (L) 1.15 - 1.40 mmol/L   TCO2 16 (L) 22 - 32 mmol/L   Hemoglobin 12.2 12.0 - 15.0 g/dL   HCT 63.9 63.9 - 53.9 %  I-Stat venous blood gas, ([facility] ED, [facility], [facility])     Status: Abnormal   Collection Time: 08/12/24 10:31 AM  Result Value Ref Range   pH, Ven 6.827 (LL) 7.25 - 7.43   pCO2, Ven 78.4 (HH) 44 - 60 mmHg   pO2, Ven 61 (H) 32 - 45 mmHg   Bicarbonate 13.0 (L) 20.0 - 28.0 mmol/L   TCO2 15 (L) 22 - 32 mmol/L   O2 Saturation 65 %   Acid-base deficit 22.0 (H) 0.0 - 2.0 mmol/L   Sodium 137 135 - 145 mmol/L   Potassium 3.9 3.5 - 5.1 mmol/L   Calcium, Ion 0.99 (L) 1.15 - 1.40 mmol/L   HCT 35.0 (L) 36.0 - 46.0 %   Hemoglobin 11.9 (L) 12.0 - 15.0 g/dL   Collection site Femoral    Drawn by HIDE    Sample type VENOUS    Comment NOTIFIED PHYSICIAN   I-Stat arterial blood gas, ED ([facility], [facility], [facility])     Status: Abnormal   Collection Time: 08/12/24 10:34 AM  Result Value Ref Range   pH, Arterial 6.870 (LL) 7.35 - 7.45   pCO2 arterial 78.9 (HH) 32 - 48 mmHg   pO2, Arterial 95 83 - 108 mmHg   Bicarbonate 14.8 (L) 20.0 - 28.0 mmol/L   TCO2 17 (L) 22 - 32 mmol/L   O2 Saturation 90 %   Acid-base deficit 20.0 (H) 0.0 - 2.0 mmol/L   Sodium 137 135 - 145 mmol/L   Potassium 3.6 3.5 - 5.1 mmol/L   Calcium, Ion 1.04 (L) 1.15 - 1.40 mmol/L   HCT 37.0 36.0 - 46.0 %   Hemoglobin 12.6 12.0 - 15.0 g/dL   Patient temperature 03.9 F    Collection site RADIAL, ALLEN'S TEST ACCEPTABLE    Drawn by  RT    Sample type ARTERIAL    Comment NOTIFIED PHYSICIAN   CBG monitoring, ED     Status: Abnormal   Collection Time: 08/12/24 11:13 AM  Result Value Ref Range   Glucose-Capillary 224 (H) 70 - 99 mg/dL    Comment: Glucose reference range  applies only to samples taken after fasting for at least 8 hours.  ABO/Rh     Status: None   Collection Time: 08/12/24 11:20 AM  Result Value Ref Range   ABO/RH(D)      A POS Performed at [facility], [address] [place], [place] [address]   MRSA Next Gen by PCR, Nasal     Status: Abnormal   Collection Time: 08/12/24 11:25 AM   Specimen: Nasal Mucosa; Nasal Swab  Result Value Ref Range   MRSA by PCR Next Gen DETECTED (A) NOT DETECTED    Comment: RESULT CALLED TO, READ BACK BY AND VERIFIED WITH: RN CANDIE FELTY [phone] @ 1524 FH (NOTE) The GeneXpert MRSA Assay (FDA approved for NASAL specimens only), is one component of a comprehensive MRSA colonization surveillance program. It is not intended to diagnose MRSA infection nor to guide or monitor treatment for MRSA infections. Test performance is not FDA approved in patients less than 74 years old. Performed at [facility], [address] [place], [place] [address]   Hemoglobin A1c     Status: Abnormal   Collection Time: 08/12/24 11:30 AM  Result Value Ref Range   Hgb A1c MFr Bld 5.8 (H) 4.8 - 5.6 %    Comment: (NOTE) Diagnosis of Diabetes The following HbA1c ranges recommended by the American Diabetes Association (ADA) may be used as an aid in the diagnosis of diabetes mellitus.  Hemoglobin             Suggested A1C NGSP%              Diagnosis  <5.7                   Non Diabetic  5.7-6.4                Pre-Diabetic  >6.4                   Diabetic  <7.0                   Glycemic control for                       adults with diabetes.     Mean Plasma Glucose 119.76 mg/dL    Comment: Performed at [facility], [address] [place], [place] [address]  TSH     Status: Abnormal   Collection Time: 08/12/24 11:39 AM  Result Value Ref Range   TSH 5.118 (H) 0.350 - 4.500 uIU/mL    Comment: Performed by a 3rd Generation assay with a functional sensitivity of <=0.01 uIU/mL. Performed at [facility], [address], [place], [place] [address]   I-Stat arterial blood gas, ED     Status: Abnormal   Collection Time: 08/12/24 11:42 AM  Result Value Ref Range   pH, Arterial 7.156 (LL) 7.35 - 7.45   pCO2 arterial 82.3 (HH) 32 - 48 mmHg   pO2, Arterial 89 83 - 108 mmHg   Bicarbonate 29.9 (H) 20.0 - 28.0 mmol/L   TCO2 33 (H) 22 - 32 mmol/L  O2 Saturation 95 %   Acid-base deficit 1.0 0.0 - 2.0 mmol/L   Sodium 139 135 - 145 mmol/L   Potassium 3.8 3.5 - 5.1 mmol/L   Calcium, Ion 0.99 (L) 1.15 - 1.40 mmol/L   HCT 30.0 (L) 36.0 - 46.0 %   Hemoglobin 10.2 (L) 12.0 - 15.0 g/dL   Patient temperature 05.0 F    Collection site RADIAL, ALLEN'S TEST ACCEPTABLE    Drawn by RT    Sample type ARTERIAL    Comment NOTIFIED PHYSICIAN   POC CBG, ED     Status: Abnormal   Collection Time: 08/12/24 11:46 AM  Result Value Ref Range   Glucose-Capillary 212 (H) 70 - 99 mg/dL    Comment: Glucose reference range applies only to samples taken after fasting for at least 8 hours.  I-Stat Lactic Acid, ED     Status: Abnormal   Collection Time: 08/12/24 12:10 PM  Result Value Ref Range   Lactic Acid, Venous 6.9 (HH) 0.5 - 1.9 mmol/L   Comment NOTIFIED PHYSICIAN   Basic metabolic panel     Status: Abnormal   Collection Time: 08/12/24 12:12 PM  Result Value Ref Range   Sodium 142 135 - 145 mmol/L   Potassium 4.0 3.5 - 5.1 mmol/L   Chloride 99 98 - 111 mmol/L   CO2 21 (L) 22 - 32 mmol/L   Glucose, Bld 196 (H) 70 - 99 mg/dL    Comment: Glucose reference range applies only to samples taken after fasting for at least 8 hours.   BUN 73 (H) 8 - 23 mg/dL   Creatinine, Ser 6.74 (H) 0.44 - 1.00 mg/dL   Calcium 6.9 (L) 8.9 - 10.3 mg/dL   GFR, Estimated 15 (L) >60 mL/min    Comment: (NOTE) Calculated using the CKD-EPI Creatinine Equation (2021)    Anion gap 22 (H) 5 - 15    Comment: Electrolytes repeated to confirm. Electrolytes repeated to confirm. Performed at [facility] Lab, [address]  [place], [place] [address]   Amylase     Status: None   Collection Time: 08/12/24 12:12 PM  Result Value Ref Range   Amylase 28 28 - 100 U/L    Comment: Performed at [facility], [address], [place], [place] [address]  Lipase, blood     Status: None   Collection Time: 08/12/24 12:12 PM  Result Value Ref Range   Lipase 21 11 - 51 U/L    Comment: Performed at [facility] Lab, [address] [place], [place] [address]  Troponin I (High Sensitivity)     Status: Abnormal   Collection Time: 08/12/24 12:12 PM  Result Value Ref Range   Troponin I (High Sensitivity) 236 (HH) <18 ng/L    Comment: CRITICAL VALUE NOTED.  VALUE IS CONSISTENT WITH PREVIOUSLY REPORTED AND CALLED VALUE. (NOTE) Elevated high sensitivity troponin I (hsTnI) values and significant  changes across serial measurements may suggest ACS but many other  chronic and acute conditions are known to elevate hsTnI results.  Refer to the Links section for chest pain algorithms and additional  guidance. Performed at [facility] Lab, [address] [place], [place] [address]   Type and screen [facility]     Status: None   Collection Time: 08/12/24 12:29 PM  Result Value Ref Range   ABO/RH(D) A POS    Antibody Screen NEG    Sample Expiration      08/07/2024,2359 Performed  at [facility] Lab, [address] [place], [place] [address]   CG4 I-STAT (Lactic acid)     Status: Abnormal   Collection Time: 08/12/24  3:14 PM  Result Value Ref Range   Lactic Acid, Venous 4.6 (HH) 0.5 - 1.9 mmol/L   Comment NOTIFIED PHYSICIAN   I-STAT 7, (LYTES, BLD GAS, ICA, H+H)     Status: Abnormal   Collection Time: 08/12/24  3:20 PM  Result Value Ref Range   pH, Arterial 7.108 (LL) 7.35 - 7.45   pCO2 arterial 84.9 (HH) 32 - 48 mmHg   pO2, Arterial 130 (H) 83 - 108 mmHg   Bicarbonate 27.3 20.0 - 28.0 mmol/L   TCO2 30 22 - 32 mmol/L   O2 Saturation 98 %   Acid-base deficit 4.0 (H) 0.0 - 2.0 mmol/L   Sodium  134 (L) 135 - 145 mmol/L   Potassium 3.5 3.5 - 5.1 mmol/L   Calcium, Ion 0.96 (L) 1.15 - 1.40 mmol/L   HCT 35.0 (L) 36.0 - 46.0 %   Hemoglobin 11.9 (L) 12.0 - 15.0 g/dL   Patient temperature 64.1 C    Sample type ARTERIAL    Comment NOTIFIED PHYSICIAN   Glucose, capillary     Status: Abnormal   Collection Time: 08/12/24  4:03 PM  Result Value Ref Range   Glucose-Capillary 267 (H) 70 - 99 mg/dL    Comment: Glucose reference range applies only to samples taken after fasting for at least 8 hours.  I-STAT 7, (LYTES, BLD GAS, ICA, H+H)     Status: Abnormal   Collection Time: 08/12/24  5:02 PM  Result Value Ref Range   pH, Arterial 7.153 (LL) 7.35 - 7.45   pCO2 arterial 72.6 (HH) 32 - 48 mmHg   pO2, Arterial 69 (L) 83 - 108 mmHg   Bicarbonate 25.4 20.0 - 28.0 mmol/L   TCO2 28 22 - 32 mmol/L   O2 Saturation 86 %   Acid-base deficit 4.0 (H) 0.0 - 2.0 mmol/L   Sodium 133 (L) 135 - 145 mmol/L   Potassium 3.6 3.5 - 5.1 mmol/L   Calcium, Ion 0.99 (L) 1.15 - 1.40 mmol/L   HCT 31.0 (L) 36.0 - 46.0 %   Hemoglobin 10.5 (L) 12.0 - 15.0 g/dL   Patient temperature 62.7 C    Collection site RADIAL, ALLEN'S TEST ACCEPTABLE    Drawn by VP    Sample type ARTERIAL    Comment NOTIFIED PHYSICIAN     DG CHEST PORT 1 VIEW Result Date: 08/12/2024 CLINICAL DATA:  Central line placement EXAM: PORTABLE CHEST 1 VIEW COMPARISON:  08/12/2024 FINDINGS: Endotracheal tube in place with tip measuring 3.9 cm above the carina. Enteric tube is present. Tip is off the field of view but below the left hemidiaphragm. Left central venous catheter with tip over the low SVC region. No pneumothorax. Low lung volumes. Normal heart size. Coarse airspace and interstitial infiltrates throughout both lungs are similar to prior study, possibly fibrosis, edema, or pneumonia. Mediastinal contours appear intact. IMPRESSION: 1. Appliances appear in satisfactory position. 2. Low lung volumes with coarse bilateral pulmonary infiltrates,  similar to prior study. Electronically Signed   By: Elsie Gravely M.D.   On: 08/12/2024 15:26   ECHOCARDIOGRAM COMPLETE Result Date: 08/12/2024    ECHOCARDIOGRAM REPORT   Patient Name:   Kelly Chambers Date of Exam: 08/12/2024 Medical Rec #:  [id number]      Height:       57.0 in Accession #:  [id number]     Weight:       92.6 lb Date of Birth:  04-Jan-1951       BSA:          1.297 m Patient Age:    73 years       BP:           106/65 mmHg Patient Gender: F              HR:           83 bpm. Exam Location:  Inpatient Procedure: 2D Echo (Both Spectral and Color Flow Doppler were utilized during            procedure). STAT ECHO Indications:    pericardial effusion  History:        Patient has prior history of Echocardiogram examinations, most                 recent 11/03/2017. CAD, COPD; Risk Factors:Hypertension,                 Dyslipidemia and Former Smoker.  Sonographer:    Tinnie Barefoot RDCS Referring Phys: 69 DANIELLE RAY IMPRESSIONS  1. Left ventricular ejection fraction, by estimation, is 50 to 55%. The left ventricle has low normal function. The left ventricle has no regional wall motion abnormalities. Left ventricular diastolic parameters are indeterminate.  2. Right ventricular systolic function is normal. The right ventricular size is moderately enlarged. There is mildly elevated pulmonary artery systolic pressure.  3. The mitral valve is normal in structure. Trivial mitral valve regurgitation. No evidence of mitral stenosis.  4. The aortic valve is tricuspid. There is mild calcification of the aortic valve. Aortic valve regurgitation is not visualized. Aortic valve sclerosis is present, with no evidence of aortic valve stenosis.  5. The inferior vena cava is normal in size with <50% respiratory variability, suggesting right atrial pressure of 8 mmHg. Comparison(s): Changes from prior study are noted. EF now low normal. Conclusion(s)/Recommendation(s): Otherwise normal echocardiogram, with minor  abnormalities described in the report. FINDINGS  Left Ventricle: Left ventricular ejection fraction, by estimation, is 50 to 55%. The left ventricle has low normal function. The left ventricle has no regional wall motion abnormalities. The left ventricular internal cavity size was normal in size. There is no left ventricular hypertrophy. Left ventricular diastolic parameters are indeterminate. Right Ventricle: The right ventricular size is moderately enlarged. Right vetricular wall thickness was not well visualized. Right ventricular systolic function is normal. There is mildly elevated pulmonary artery systolic pressure. The tricuspid regurgitant velocity is 2.77 m/s, and with an assumed right atrial pressure of 8 mmHg, the estimated right ventricular systolic pressure is 38.7 mmHg. Left Atrium: Left atrial size was normal in size. Right Atrium: Right atrial size was normal in size. Pericardium: There is no evidence of pericardial effusion. Mitral Valve: The mitral valve is normal in structure. Trivial mitral valve regurgitation. No evidence of mitral valve stenosis. Tricuspid Valve: The tricuspid valve is normal in structure. Tricuspid valve regurgitation is mild . No evidence of tricuspid stenosis. Aortic Valve: The aortic valve is tricuspid. There is mild calcification of the aortic valve. Aortic valve regurgitation is not visualized. Aortic valve sclerosis is present, with no evidence of aortic valve stenosis. Pulmonic Valve: The pulmonic valve was not well visualized. Pulmonic valve regurgitation is mild. No evidence of pulmonic stenosis. Aorta: The aortic root and ascending aorta are structurally normal, with no evidence of dilitation. Venous: The inferior vena cava is  normal in size with less than 50% respiratory variability, suggesting right atrial pressure of 8 mmHg. IAS/Shunts: The atrial septum is grossly normal.  LEFT VENTRICLE PLAX 2D LVIDd:         3.60 cm   Diastology LVIDs:         2.70 cm   LV e'  medial:    6.53 cm/s LV PW:         0.90 cm   LV E/e' medial:  8.7 LV IVS:        1.00 cm   LV e' lateral:   8.05 cm/s LVOT diam:     1.80 cm   LV E/e' lateral: 7.1 LV SV:         38 LV SV Index:   29 LVOT Area:     2.54 cm  RIGHT VENTRICLE             IVC RV Basal diam:  2.90 cm     IVC diam: 1.70 cm RV S prime:     15.40 cm/s TAPSE (M-mode): 1.8 cm LEFT ATRIUM           Index        RIGHT ATRIUM           Index LA diam:      2.70 cm 2.08 cm/m   RA Area:     11.70 cm LA Vol (A2C): 25.9 ml 19.98 ml/m  RA Volume:   28.00 ml  21.60 ml/m LA Vol (A4C): 28.5 ml 21.98 ml/m  AORTIC VALVE LVOT Vmax:   106.00 cm/s LVOT Vmean:  54.500 cm/s LVOT VTI:    0.149 m  AORTA Ao Root diam: 3.30 cm Ao Asc diam:  3.10 cm MITRAL VALVE               TRICUSPID VALVE MV Area (PHT): 3.77 cm    TR Peak grad:   30.7 mmHg MV Decel Time: 201 msec    TR Vmax:        277.00 cm/s MV E velocity: 57.10 cm/s MV A velocity: 94.90 cm/s  SHUNTS MV E/A ratio:  0.60        Systemic VTI:  0.15 m                            Systemic Diam: 1.80 cm Shelda Bruckner MD Electronically signed by Shelda Bruckner MD Signature Date/Time: 08/12/2024/2:35:50 PM    Final    CT Angio Chest PE W and/or Wo Contrast Result Date: 08/12/2024 CLINICAL DATA:  Cardiac arrest.  Status post CPR. EXAM: CT ANGIOGRAPHY CHEST WITH CONTRAST TECHNIQUE: Multidetector CT imaging of the chest was performed using the standard protocol during bolus administration of intravenous contrast. Multiplanar CT image reconstructions and MIPs were obtained to evaluate the vascular anatomy. RADIATION DOSE REDUCTION: This exam was performed according to the departmental dose-optimization program which includes automated exposure control, adjustment of the mA and/or kV according to patient size and/or use of iterative reconstruction technique. CONTRAST:  75mL OMNIPAQUE  IOHEXOL  350 MG/ML SOLN COMPARISON:  None available currently. FINDINGS: Cardiovascular: Satisfactory opacification of  the pulmonary arteries to the segmental level. No evidence of pulmonary embolism. Normal heart size. No pericardial effusion. Aortic atherosclerosis. Mediastinum/Nodes: Endotracheal tube is in grossly good position. Thyroid  gland is unremarkable. Nasogastric tube is seen passing through esophagus into stomach. No definite adenopathy is noted. Lungs/Pleura: No pneumothorax or pleural effusion is noted. Patchy opacities are noted throughout both  lungs, but most prominently seen in the upper lobes, most consistent with multifocal pneumonia or less likely edema. Possible emphysematous disease is noted as well. Upper Abdomen: No acute abnormality. Musculoskeletal: No chest wall abnormality. No acute or significant osseous findings. Review of the MIP images confirms the above findings. IMPRESSION: 1. No definite evidence of pulmonary embolus. 2. Patchy opacities are noted throughout both lungs, but most prominently seen in the upper lobes, most consistent with multifocal pneumonia or less likely edema. 3. Endotracheal and nasogastric tubes are in grossly good position. Aortic Atherosclerosis (ICD10-I70.0) and Emphysema (ICD10-J43.9). Electronically Signed   By: Lynwood Landy Raddle M.D.   On: 08/12/2024 14:11   CT Soft Tissue Neck W Contrast Addendum Date: 08/12/2024 ADDENDUM REPORT: 08/12/2024 13:10 ADDENDUM: Critical Value/emergent results were called by telephone at the time of interpretation on 08/12/2024 at 1304 hours to ICU Dr. Theophilus, who verbally acknowledged these results. Electronically Signed   By: VEAR Hurst M.D.   On: 08/12/2024 13:10   Result Date: 08/12/2024 CLINICAL DATA:  73 year old female status post CPR, witnessed arrest. Recent neck swelling of unknown etiology. EXAM: CT NECK WITH CONTRAST TECHNIQUE: Multidetector CT imaging of the neck was performed using the standard protocol following the bolus administration of intravenous contrast. RADIATION DOSE REDUCTION: This exam was performed according to the  departmental dose-optimization program which includes automated exposure control, adjustment of the mA and/or kV according to patient size and/or use of iterative reconstruction technique. CONTRAST:  75mL OMNIPAQUE  IOHEXOL  350 MG/ML SOLN COMPARISON:  Head CT, CTA chest reported separately today. FINDINGS: Pharynx and larynx: Intubated. Oral enteric tube in place which loops mildly in the nasopharynx. Expected course of both tubes Confluent abnormal soft tissue edema and/or fluid tracking into the right parapharyngeal and retropharyngeal spaces, epicenter appears to be the sublingual space as stated below. Left parapharyngeal space is spared. Salivary glands: Severe abnormality of the sublingual space appears to be multifocal serpiginous and rim enhancing abscesses bilaterally (series 3, image 51 and coronal image 71). Individual rim enhancing fluid collections in the sublingual space are up to 35 by 34 x 15 mm (AP by transverse by CC), indicating up to 8 mL each. And note extensive tracking of the right side sublingual abscess through the right masticator space cephalad, medial and posterior to the right mandible (series 3, image 33 and sagittal image 37). That heterogeneous abscess site encompasses 60 x 18 x 25 mm (AP by transverse by CC) for an estimated volume of 14 mL. And serpiginous extension of that lesion into the right parapharyngeal and retropharyngeal spaces (series 3 images 21 and 31 respectively). Furthermore, the right parotid space appears asymmetrically enlarged and heterogeneous (compare bilaterally on coronal image 52) which could be primary or secondary infection. The right submandibular gland is also highly heterogeneous and irregular. The left submandibular space and left masticator spaces are relatively spared. Left parotid gland appears negative. Thyroid : Diminutive, negative. Lymph nodes: Negative, no significant cervical lymphadenopathy. Vascular: Major vascular structures in the bilateral  neck and at the skull base are patent and enhancing. Increased reactive appearing retropharyngeal venous enhancement such as on coronal image 45. Limited intracranial: No abnormal intracranial enhancement identified, head CT reported separately. Visualized orbits: Negative. Mastoids and visualized paranasal sinuses: Well aerated bilaterally. Skeleton: Absent dentition. Mild C7 anterior superior endplate compression, appears to be chronic. Advanced C5-C6 chronic disc and endplate degeneration. No acute or suspicious cervical vertebral findings. No acute or suspicious osseous abnormality. Upper chest: Multifocal confluent abnormal peribronchial and peripheral  upper lung opacity appears Infectious or inflammatory. Endotracheal tube tip is partially visible just above the carina. Enteric tube courses in the visible esophagus. No superior mediastinal lymphadenopathy. See Chest CTA reported separately. IMPRESSION: 1. 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 Multiple discrete estimated 8-14 mL rim enhancing abscesses throughout those areas. Involvement of the right submandibular and right parotid glands. Absent dentition, original origin of infection uncertain. Recommend urgent ENT consultation. 2. Intubated with satisfactory visible ET tube and enteric tube. 3. Abnormal upper lung opacity is extensive, appears infectious or inflammatory. No definite superior mediastinal inflammation. See CTA Chest reported separately. Electronically Signed: By: VEAR Hurst M.D. On: 08/12/2024 13:03   CT HEAD WO CONTRAST Result Date: 08/12/2024 CLINICAL DATA:  Provided history: Mental status change, unknown cause. Additional history provided: Witnessed arrest status post CPR. EXAM: CT HEAD WITHOUT CONTRAST TECHNIQUE: Contiguous axial images  were obtained from the base of the skull through the vertex without intravenous contrast. RADIATION DOSE REDUCTION: This exam was performed according to the departmental dose-optimization program which includes automated exposure control, adjustment of the mA and/or kV according to patient size and/or use of iterative reconstruction technique. COMPARISON:  Maxillofacial CT 07/08/2019. Report from head CT 09/25/2018 (images unavailable). FINDINGS: Brain: No age-advanced or lobar predominant cerebral atrophy. Mild-to-moderate patchy and ill-defined hypoattenuation within the cerebral white matter, nonspecific but compatible chronic small vessel ischemic disease. There is no acute intracranial hemorrhage. No demarcated cortical infarct. Gray-white differentiation is preserved. No extra-axial fluid collection. No evidence of an intracranial mass. No midline shift. Vascular: No hyperdense vessel.  Atherosclerotic calcifications. Skull: No acute calvarial fracture or aggressive osseous lesion. Prior right retrosigmoid craniotomy. Per the electronic medical record, the patient has a history of trigeminal nerve decompression. Sinuses/Orbits: No mass or acute finding within the imaged orbits. No significant paranasal sinus disease at the imaged levels. IMPRESSION: 1. No evidence of an acute intracranial abnormality. Please note, a brain MRI would have greater sensitivity for acute hypoxic/ischemic injury. 2. Parenchymal atrophy and chronic small vessel ischemic disease. Electronically Signed   By: Rockey Childs D.O.   On: 08/12/2024 12:52   DG Abd Portable 1V Result Date: 08/12/2024 CLINICAL DATA:  [id number] Do not intubate, perform CPR, or defibrillate [phone]. EXAM: PORTABLE CHEST 1 VIEW COMPARISON:  None Available. FINDINGS: There is increased amount of gas throughout the small bowel and colon, likely manifestation of adynamic ileus. Consider radiographic follow up if clinically indicated. No evidence of pneumoperitoneum.  There are heterogeneous moderate-to-severe alveolar and interstitial opacities throughout bilateral lungs. Findings are nonspecific and differential diagnosis includes multilobar pneumonia, ARDS or pulmonary edema. Correlate clinically. No pneumothorax. No significant pleural effusion seen. Normal cardio-mediastinal silhouette. No acute osseous abnormalities. The soft tissues are within normal limits. Surgical changes, devices, tubes and lines: Endotracheal tube is seen with its tip in the right proximal mainstem bronchus. Recommend withdrawing the endotracheal tube by 2-2.5 cm. There are surgical clips in the right upper quadrant, typical of a previous cholecystectomy. Enteric tube is seen with its tip just below the level of carina. Repositioning is recommended. IMPRESSION: 1. Endotracheal tube is seen with its tip in the right proximal mainstem bronchus. Repositioning is recommended. 2. Enteric tube is seen with its tip just below the level of carina. Repositioning is recommended. 3. There are heterogeneous moderate-to-severe alveolar and interstitial opacities throughout bilateral lungs.  Findings are nonspecific and differential diagnosis includes multilobar pneumonia, ARDS or pulmonary edema. 4. There is increased amount of gas throughout the small bowel and colon, likely manifestation of adynamic ileus. Consider radiographic follow up if clinically indicated. These results will be called to the ordering clinician or representative by the Radiologist Assistant, and communication documented in the PACS or [employer]. Electronically Signed   By: Ree Molt M.D.   On: 08/12/2024 10:44   DG Chest Port 1 View Result Date: 08/12/2024 CLINICAL DATA:  [id number] Do not intubate, perform CPR, or defibrillate [phone]. EXAM: PORTABLE CHEST 1 VIEW COMPARISON:  None Available. FINDINGS: There is increased amount of gas throughout the small bowel and colon, likely manifestation of adynamic ileus. Consider  radiographic follow up if clinically indicated. No evidence of pneumoperitoneum. There are heterogeneous moderate-to-severe alveolar and interstitial opacities throughout bilateral lungs. Findings are nonspecific and differential diagnosis includes multilobar pneumonia, ARDS or pulmonary edema. Correlate clinically. No pneumothorax. No significant pleural effusion seen. Normal cardio-mediastinal silhouette. No acute osseous abnormalities. The soft tissues are within normal limits. Surgical changes, devices, tubes and lines: Endotracheal tube is seen with its tip in the right proximal mainstem bronchus. Recommend withdrawing the endotracheal tube by 2-2.5 cm. There are surgical clips in the right upper quadrant, typical of a previous cholecystectomy. Enteric tube is seen with its tip just below the level of carina. Repositioning is recommended. IMPRESSION: 1. Endotracheal tube is seen with its tip in the right proximal mainstem bronchus. Repositioning is recommended. 2. Enteric tube is seen with its tip just below the level of carina. Repositioning is recommended. 3. There are heterogeneous moderate-to-severe alveolar and interstitial opacities throughout bilateral lungs. Findings are nonspecific and differential diagnosis includes multilobar pneumonia, ARDS or pulmonary edema. 4. There is increased amount of gas throughout the small bowel and colon, likely manifestation of adynamic ileus. Consider radiographic follow up if clinically indicated. These results will be called to the ordering clinician or representative by the Radiologist Assistant, and communication documented in the PACS or [employer]. Electronically Signed   By: Ree Molt M.D.   On: 08/12/2024 10:44    MND:wzhjupcz other than stated per HPI  Blood pressure 121/64, pulse 99, temperature 99.5 F (37.5 C), resp. rate (!) 28, height 4' 9 (1.448 m), weight 42 kg, SpO2 (!) 89%.  PHYSICAL EXAM:  Intubated sedated in bed.  Submental and  submandibular space with fullness.  Overlying skin is not violaceous or peeking abscess.  Floor mouth is somewhat firm.  ET tube is firmly secured.  Results reviewed: CT neck with contrast: Per my read multiloculated deep space head and neck abscess involving bilateral sublingual space, submandibular space, mass right masticator space, parapharyngeal space and retropharyngeal space.  Formal read:  IMPRESSION: 1. 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 Multiple discrete estimated 8-14 mL rim enhancing abscesses throughout those areas. Involvement of the right submandibular and right parotid glands. Absent dentition, original origin of infection uncertain. Recommend urgent ENT consultation.   2. Intubated with satisfactory visible ET tube and enteric tube.   3. Abnormal upper lung opacity is extensive, appears infectious or inflammatory. No definite superior mediastinal inflammation. See CTA Chest reported separately.   Electronically Signed: By: VEAR Hurst M.D. 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  White blood cell count 48.  On IV antibiotics.  Recommendations: Urgent incision and drainage and washout in the operating room.  Awaiting OR slot pending other emergencies that are currently ongoing.  Informed consent obtained from patient's husband.  Patient will likely need multiple days of intubation and possibly takeback washouts over the weekend.   Medical Decision Making: 4 - High  #/Compl Problems  4                         Data Rev  4                 Management 4             (1-Straightforward, 2-Low, 3- Moderate,  4-High)  Electronically signed by:  Elspeth Coddington, MD  Staff Physician Facial Plastic & Reconstructive Surgery Otolaryngology - Head and Neck Surgery [facility]   08/12/2024, 6:07 PM

## 2024-08-12 NOTE — Progress Notes (Signed)
 Spoke w pt husband and sister at bedside.   Discussed current clinical situation -- critically ill post arrest on MV and pressors, c/f myoclonus vs seizures, multifocal PNA, c/f ludwig.  Discussed plan of care and obtained consent for central line, arterial line.  All questions answered    Ronnald Gave MSN, AGACNP-BC [facility] Pulmonary/Critical Care Medicine 08/12/2024, 2:25 PM

## 2024-08-12 NOTE — Anesthesia Preprocedure Evaluation (Signed)
 Anesthesia Evaluation  Patient identified by MRN, date of birth, ID band Patient unresponsive  General Assessment Comment:BILATERAL NECK ABSCESS. Code at home, LMA in field, intubation/sedation in ER, s/p CVL placement, arterial line placement and initiation of norepinephrine  infusion for continued hypotension in setting of sepsis.  Reviewed: Allergy & Precautions, NPO status , Patient's Chart, lab work & pertinent test results, Unable to perform ROS - Chart review onlyPreop documentation limited or incomplete due to emergent nature of procedure.  Airway Mallampati: Intubated  TM Distance: >3 FB Neck ROM: Full    Dental   Pulmonary COPD     unstable + intubated    Cardiovascular hypertension, + CAD  Normal cardiovascular exam Rhythm:Regular Rate:Normal     Neuro/Psych  PSYCHIATRIC DISORDERS Anxiety Depression    Myoclonic activity per bedside nurse. CVA    GI/Hepatic Neg liver ROS,GERD  ,,  Endo/Other  Hypothyroidism    Renal/GU Renal InsufficiencyRenal disease     Musculoskeletal  (+) Arthritis ,    Abdominal   Peds  Hematology  (+) Blood dyscrasia, anemia   Anesthesia Other Findings Day of surgery medications reviewed with the patient.  Reproductive/Obstetrics H/o ovarian cancer                               Anesthesia Physical Anesthesia Plan  ASA: 5 and emergent  Anesthesia Plan:    Post-op Pain Management:    Induction: Inhalational  PONV Risk Score and Plan: 2 and Midazolam  and Treatment may vary due to age or medical condition  Airway Management Planned: Oral ETT  Additional Equipment:   Intra-op Plan:   Post-operative Plan: Post-operative intubation/ventilation  Informed Consent:      History available from chart only and Only emergency history available  Plan Discussed with: CRNA  Anesthesia Plan Comments: (Attempted to contact patient's husband x2 and daughter  x2 by telephone. Will proceed with emergent consent for anesthesia.)         Anesthesia Quick Evaluation

## 2024-08-12 NOTE — Progress Notes (Signed)
 ABG drawn with critical values resulted, CCM made aware.

## 2024-08-12 NOTE — Progress Notes (Signed)
 Patient transported from ED Trauma B to CT and from CT to 2H17 with no complications noted.

## 2024-08-12 NOTE — ED Provider Notes (Addendum)
 Despite decreased GFR this patient requires emergent contrasted CT due to concern for PE resulting in cardiac arrest.  Patient is obtunded, intubated unable to obtain consent from family.  CT to be done by emergent consent.  Benefits outweigh the risk in this case.  The 2020 consensus statement from the [employer] of Radiology and [employer] states, The risk of acute kidney injury (AKI) developing in patients with reduced kidney function following exposure to intravenous iodinated contrast media has been overstated. ... If contrast media administration is required for a life-threatening diagnosis, then it should not be withheld based on kidney function. 1  Additionally, Aycock et al. found that  Meta-analysis demonstrated that, compared with noncontrast CT, contrast-enhanced CT was not significantly associated with either acute kidney injury (odds ratio [OR] 0.94; 95% confidence interval [CI] 0.83 to 1.07), need for renal replacement therapy (OR 0.83; 95% CI 0.59 to 1.16), or all-cause mortality (OR 1.0; 95% CI 0.73 to 1.36) ... We found no significant differences in our principal study outcomes between patients receiving contrast-enhanced CT versus those receiving noncontrast CT. Given similar frequencies of acute kidney injury in patients receiving noncontrast CT, other patient- and illness-level factors, rather than the use of contrast material, likely contribute to the development of acute kidney injury. 2  I have reviewed the labs and believe that the risk-benefit analysis favors using contrast when obtaining the CT imaging, and it is medically necessary for the patient to receive the requested CT scan despite documented kidney function.    Davenport MS, Perazella MA, Yee J, Dillman JR, Fine D, McDonald RJ, Rodby RA, Cesario DELBRA Marcello JOLYNN. Use of Intravenous Iodinated Contrast Media in Patients with Kidney Disease: Consensus Statements from the [employer] of Radiology and  the [employer]. Radiology. 2020 Mar;294(3):660-668. doi: 10.1148/radiol.7980807905. Epub 2020 Jan 21. PMID: 68038753. Aycock RD et al: Acute Kidney Injury After Computed Tomography: A Meta-analysis. [facility] Med. 2017 Aug 12.    For further, see below:    Wilhelm-Leen et al, Estimating the Risk of Radiocontrast-Associated Nephropathy, JINNY Janus Soc Nephrol 71/ 346-340, 2017  Silva SAHA et al: Intravenous Contrast Material Exposure Is Not an Independent Risk Factor for Dialysis or Mortality,  Radiology: Vol. 273: #3--Dec 2014  Hinson JS et al: Risk of Acute Kidney Injury After Intravenous Contrast Media Administration,  Ann Emerg Med,   Elia Benton, Editorial/ Contrast-associated acute kidney injury is a myth: Yes, Intensive Care Med [phone]) [phone]   Sharyne Darina RAMAN, MD 08/12/24 1230    Sharyne Darina RAMAN, MD 08/12/24 1242    Levander Houston, MD 08-26-24 [id number]

## 2024-08-12 NOTE — Anesthesia Postprocedure Evaluation (Signed)
 Anesthesia Post Note  Patient: Kelly Chambers  Procedure(s) Performed: IRRIGATION AND DEBRIDEMENT neck and bilateral  ABSCESS (Bilateral: Neck)     Patient location during evaluation: SICU Anesthesia Type: General Level of consciousness: sedated Pain management: pain level controlled Vital Signs Assessment: post-procedure vital signs reviewed and stable Respiratory status: patient remains intubated per anesthesia plan Cardiovascular status: stable Postop Assessment: no apparent nausea or vomiting Anesthetic complications: no   No notable events documented.  Last Vitals:  Vitals:   08/12/24 2200 08/12/24 2215  BP:    Pulse:  88  Resp: (!) 35 (!) 35  Temp: 36.6 C 36.5 C  SpO2:  98%    Last Pain:  Vitals:   08/12/24 2215  TempSrc: Bladder                 Garnette FORBES Skillern

## 2024-08-12 NOTE — ED Notes (Signed)
 Admitting at bedside when O2 was dropping, RT called to bedside.

## 2024-08-12 NOTE — Op Note (Signed)
 OPERATIVE NOTE  Kelly Chambers Date/Time of Admission: 08/12/2024 10:06 AM  CSN: [id number];MRN:[id number] Attending Provider: Mannam, Praveen, MD Room/Bed: MCPO/NONE DOB: 19-Apr-1951 Age: 73 y.o.   Pre-Op Diagnosis: BILATERAL NECK ABSCESS Kelly Chambers SEPTIC SHOCK  Post-Op Diagnosis: SAME  Procedure: Incision and drainage bilateral deep space neck abscess (CPT 21501)  Anesthesia: General  Surgeon(s): Kelly KANDICE Coddington, MD  Staff: Circulator: Kelly Almarie BROCKS, RN Scrub Person: Kelly Chambers, Kelly Chambers  Implants: * No implants in log *  Specimens: ID Type Source Tests Collected by Time Destination  A : floor of mouth aerobic anaerobic culture on unasyn  Abscess Abscess AEROBIC/ANAEROBIC CULTURE W GRAM STAIN (SURGICAL/DEEP WOUND) [name], MD 08/12/2024 2009     Complications: none  EBL: 50 ML  IVF: Per anesthesia ML  Condition: stable  Operative Findings:  Bilateral multiloculated deep space sublingual floor of mouth abscess (Kelly Chambers) extending to left masticator space, parapharyngeal space and retropharyngeal space. Approximately 50 mL frank pus evacuated from neck. Neck irrigated with 1000 mL sterile saline  Indications for Procedure: 73 year old female who presented in cardiac arrest possibly due to septic shock from Ludwig's Chambers/deep space neck abscesses who presents today for surgery.   Description of Operation: Patient was identified in the preoperative area and consent confirmed in the chart.  She was brought to the operating room by anesthesia and a preoperative timeout was performed confirming the patient identity and procedure to be performed.  Once all were in agreement we proceeded with surgery.  The patient's head was positioned on a doughnut and a shoulder roll applied.  A submandibular/submental incision in the pre-existing neck crease was marked out 7 cm.  Local anesthetic was infiltrated a total of 10 mL of 1% lidocaine   1-100,000 epinephrine .  Patient was prepped and draped in standard fashion for a procedure of this kind.  A Final preoperative pause was performed and we proceeded with surgery.  15 blade was used to incise the skin through the subplatysmal layer.  Bovie was used for cauterization.  The submandibular gland fascia was incised and dissected to the submental space.  The submental fascia was dissected between the anterior digastric bellies and the mylohyoid muscle was divided with a tonsil dissector.  Frank pus was encountered in the sublingual space. The tonsil and digital dissection was used to copiously dissect and evacuate all multiloculated abscesses identified on the CT scan.  Dissection was performed all the way along the right mandible to the masticator space parapharyngeal space and skull base.  Care was taken posteriorly not to traumatize the internal carotid artery.  About 50 mL of frank purulent drainage was evacuated.  The neck was then copiously irrigated with 1000 mL of sterile saline.  1/2 inch Penrose drain was placed into the right masticator space and a second one in the left sublingual space and secured with 2-0 nylon.  Half of the incision was re-approximated with buried interrupted 3-0 Vicryl sutures for the subcutaneous layer and interrupted 5-0 nylon for the skin.  A dressing consisting of gauze fluffs and Band-Aid was applied.  The patient was then turned back to anesthesia who brought her to the cardiac ICU in stable condition.  Plan: - The patient will remain in the cardiac ICU on systemic IV antibiotics following up surgical cultures - Penrose drains to remain at least for 72 hours. - Should the patient develop clinical worsening signs of infection over the weekend I would recommend a repeat CT of the neck  with contrast to reevaluate for recurrent abscess  Electronically signed by:  Kelly Coddington, MD  Staff Physician Facial Plastic & Reconstructive Surgery Otolaryngology - Head  and Neck Surgery [facility]     Kelly KANDICE Coddington, MD [facility]  08/12/2024

## 2024-08-12 NOTE — Transfer of Care (Signed)
 Immediate Anesthesia Transfer of Care Note  Patient: Kelly Chambers  Procedure(s) Performed: IRRIGATION AND DEBRIDEMENT neck and bilateral  ABSCESS (Bilateral: Neck)  Patient Location: SICU  Anesthesia Type:General  Level of Consciousness: sedated and Patient remains intubated per anesthesia plan  Airway & Oxygen  Therapy: Patient remains intubated per anesthesia plan and Patient placed on Ventilator (see vital sign flow sheet for setting)  Post-op Assessment: Report given to RN and Post -op Vital signs reviewed and stable  Post vital signs: Reviewed and stable  Last Vitals:  Vitals Value Taken Time  BP    Temp 36.8 C 08/12/24 20:57  Pulse 90 08/12/24 20:57  Resp 34 08/12/24 20:57  SpO2 88 % 08/12/24 20:57  Vitals shown include unfiled device data.  Last Pain:  Vitals:   08/12/24 1600  TempSrc: Bladder         Complications: No notable events documented.

## 2024-08-12 NOTE — ED Notes (Signed)
 X-ray at bedside.

## 2024-08-12 NOTE — ED Notes (Signed)
 Pt intubated @ 1017, ET 7.5, 24 @ lip.

## 2024-08-12 NOTE — Progress Notes (Signed)
 Patient not available for EEG due to scheduled for surgery. Tech will check back for availability as schedule allows.

## 2024-08-12 NOTE — ED Notes (Signed)
 EDP Ray drew ABG from Rt femoral artery at this time.

## 2024-08-13 ENCOUNTER — Inpatient Hospital Stay (HOSPITAL_COMMUNITY)

## 2024-08-13 DIAGNOSIS — N1831 Chronic kidney disease, stage 3a: Secondary | ICD-10-CM

## 2024-08-13 DIAGNOSIS — J9621 Acute and chronic respiratory failure with hypoxia: Secondary | ICD-10-CM | POA: Diagnosis not present

## 2024-08-13 DIAGNOSIS — J9622 Acute and chronic respiratory failure with hypercapnia: Secondary | ICD-10-CM

## 2024-08-13 DIAGNOSIS — G253 Myoclonus: Secondary | ICD-10-CM

## 2024-08-13 DIAGNOSIS — I469 Cardiac arrest, cause unspecified: Secondary | ICD-10-CM | POA: Diagnosis not present

## 2024-08-13 DIAGNOSIS — N179 Acute kidney failure, unspecified: Secondary | ICD-10-CM

## 2024-08-13 DIAGNOSIS — A419 Sepsis, unspecified organism: Secondary | ICD-10-CM

## 2024-08-13 DIAGNOSIS — E8721 Acute metabolic acidosis: Secondary | ICD-10-CM

## 2024-08-13 LAB — POCT I-STAT 7, (LYTES, BLD GAS, ICA,H+H)
Acid-Base Excess: 0 mmol/L (ref 0.0–2.0)
Acid-base deficit: 6 mmol/L — ABNORMAL HIGH (ref 0.0–2.0)
Acid-base deficit: 6 mmol/L — ABNORMAL HIGH (ref 0.0–2.0)
Bicarbonate: 20.3 mmol/L (ref 20.0–28.0)
Bicarbonate: 21.8 mmol/L (ref 20.0–28.0)
Bicarbonate: 27.4 mmol/L (ref 20.0–28.0)
Calcium, Ion: 0.9 mmol/L — ABNORMAL LOW (ref 1.15–1.40)
Calcium, Ion: 0.96 mmol/L — ABNORMAL LOW (ref 1.15–1.40)
Calcium, Ion: 0.96 mmol/L — ABNORMAL LOW (ref 1.15–1.40)
HCT: 27 % — ABNORMAL LOW (ref 36.0–46.0)
HCT: 31 % — ABNORMAL LOW (ref 36.0–46.0)
HCT: 33 % — ABNORMAL LOW (ref 36.0–46.0)
Hemoglobin: 9.2 g/dL — ABNORMAL LOW (ref 12.0–15.0)
Hemoglobin: 10.5 g/dL — ABNORMAL LOW (ref 12.0–15.0)
Hemoglobin: 11.2 g/dL — ABNORMAL LOW (ref 12.0–15.0)
O2 Saturation: 93 %
O2 Saturation: 97 %
O2 Saturation: 98 %
Patient temperature: 36.2
Patient temperature: 37.2
Patient temperature: 36.3
Potassium: 3.3 mmol/L — ABNORMAL LOW (ref 3.5–5.1)
Potassium: 3.7 mmol/L (ref 3.5–5.1)
Potassium: 3.8 mmol/L (ref 3.5–5.1)
Sodium: 137 mmol/L (ref 135–145)
Sodium: 134 mmol/L — ABNORMAL LOW (ref 135–145)
Sodium: 134 mmol/L — ABNORMAL LOW (ref 135–145)
TCO2: 22 mmol/L (ref 22–32)
TCO2: 23 mmol/L (ref 22–32)
TCO2: 29 mmol/L (ref 22–32)
pCO2 arterial: 43.1 mmHg (ref 32–48)
pCO2 arterial: 53.1 mmHg — ABNORMAL HIGH (ref 32–48)
pCO2 arterial: 57.6 mmHg — ABNORMAL HIGH (ref 32–48)
pH, Arterial: 7.276 — ABNORMAL LOW (ref 7.35–7.45)
pH, Arterial: 7.223 — ABNORMAL LOW (ref 7.35–7.45)
pH, Arterial: 7.282 — ABNORMAL LOW (ref 7.35–7.45)
pO2, Arterial: 73 mmHg — ABNORMAL LOW (ref 83–108)
pO2, Arterial: 110 mmHg — ABNORMAL HIGH (ref 83–108)
pO2, Arterial: 110 mmHg — ABNORMAL HIGH (ref 83–108)

## 2024-08-13 LAB — GLUCOSE, CAPILLARY
Glucose-Capillary: 102 mg/dL — ABNORMAL HIGH (ref 70–99)
Glucose-Capillary: 114 mg/dL — ABNORMAL HIGH (ref 70–99)
Glucose-Capillary: 116 mg/dL — ABNORMAL HIGH (ref 70–99)
Glucose-Capillary: 128 mg/dL — ABNORMAL HIGH (ref 70–99)
Glucose-Capillary: 129 mg/dL — ABNORMAL HIGH (ref 70–99)
Glucose-Capillary: 80 mg/dL (ref 70–99)

## 2024-08-13 LAB — CBC
HCT: 29.5 % — ABNORMAL LOW (ref 36.0–46.0)
Hemoglobin: 9.9 g/dL — ABNORMAL LOW (ref 12.0–15.0)
MCH: 33 pg (ref 26.0–34.0)
MCHC: 33.6 g/dL (ref 30.0–36.0)
MCV: 98.3 fL (ref 80.0–100.0)
Platelets: 308 K/uL (ref 150–400)
RBC: 3 MIL/uL — ABNORMAL LOW (ref 3.87–5.11)
RDW: 12.7 % (ref 11.5–15.5)
WBC: 27.9 K/uL — ABNORMAL HIGH (ref 4.0–10.5)
nRBC: 0.7 % — ABNORMAL HIGH (ref 0.0–0.2)

## 2024-08-13 LAB — LACTIC ACID, PLASMA
Lactic Acid, Venous: 3.4 mmol/L (ref 0.5–1.9)
Lactic Acid, Venous: 3.3 mmol/L (ref 0.5–1.9)

## 2024-08-13 LAB — CG4 I-STAT (LACTIC ACID)
Lactic Acid, Venous: 2.2 mmol/L (ref 0.5–1.9)
Lactic Acid, Venous: 2.6 mmol/L (ref 0.5–1.9)

## 2024-08-13 LAB — BASIC METABOLIC PANEL WITH GFR
Anion gap: 18 — ABNORMAL HIGH (ref 5–15)
BUN: 75 mg/dL — ABNORMAL HIGH (ref 8–23)
CO2: 20 mmol/L — ABNORMAL LOW (ref 22–32)
Calcium: 7.2 mg/dL — ABNORMAL LOW (ref 8.9–10.3)
Chloride: 95 mmol/L — ABNORMAL LOW (ref 98–111)
Creatinine, Ser: 3.51 mg/dL — ABNORMAL HIGH (ref 0.44–1.00)
GFR, Estimated: 13 mL/min — ABNORMAL LOW (ref >60)
Glucose, Bld: 98 mg/dL (ref 70–99)
Potassium: 3.7 mmol/L (ref 3.5–5.1)
Sodium: 133 mmol/L — ABNORMAL LOW (ref 135–145)

## 2024-08-13 LAB — BRAIN NATRIURETIC PEPTIDE: B Natriuretic Peptide: 1353.3 pg/mL — ABNORMAL HIGH (ref 0.0–100.0)

## 2024-08-13 MED ORDER — FENTANYL BOLUS VIA INFUSION
25.0000 ug | INTRAVENOUS | Status: DC | PRN
  Administered 2024-08-14: 25 ug via INTRAVENOUS
  Administered 2024-08-14: 50 ug via INTRAVENOUS
  Administered 2024-08-14: 25 ug via INTRAVENOUS

## 2024-08-13 MED ORDER — FENTANYL CITRATE PF 50 MCG/ML IJ SOSY
25.0000 ug | PREFILLED_SYRINGE | Freq: Once | INTRAMUSCULAR | Status: AC
  Administered 2024-08-13: 25 ug via INTRAVENOUS

## 2024-08-13 MED ORDER — HEPARIN SODIUM (PORCINE) 5000 UNIT/ML IJ SOLN
5000.0000 [IU] | Freq: Three times a day (TID) | INTRAMUSCULAR | Status: DC
  Administered 2024-08-13 – 2024-08-15 (×6): 5000 [IU] via SUBCUTANEOUS
  Filled 2024-08-13 (×5): qty 1

## 2024-08-13 MED ORDER — FUROSEMIDE 10 MG/ML IJ SOLN
160.0000 mg | Freq: Once | INTRAVENOUS | Status: AC
  Administered 2024-08-13: 160 mg via INTRAVENOUS
  Filled 2024-08-13: qty 10

## 2024-08-13 MED ORDER — FENTANYL 2500MCG IN NS 250ML (10MCG/ML) PREMIX INFUSION
0.0000 ug/h | INTRAVENOUS | Status: DC
  Administered 2024-08-13: 25 ug/h via INTRAVENOUS
  Filled 2024-08-13: qty 250

## 2024-08-13 MED ORDER — HYDROCORTISONE SOD SUC (PF) 100 MG IJ SOLR
100.0000 mg | Freq: Every day | INTRAMUSCULAR | Status: DC
  Administered 2024-08-13: 100 mg via INTRAVENOUS
  Filled 2024-08-13: qty 2

## 2024-08-13 MED ORDER — DEXTROSE IN LACTATED RINGERS 5 % IV SOLN: INTRAVENOUS | Status: AC

## 2024-08-13 MED ORDER — CALCIUM GLUCONATE-NACL 2-0.675 GM/100ML-% IV SOLN
2.0000 g | Freq: Once | INTRAVENOUS | Status: AC
  Administered 2024-08-13: 2000 mg via INTRAVENOUS
  Filled 2024-08-13: qty 100

## 2024-08-13 MED ORDER — SODIUM CHLORIDE 0.9 % IV SOLN
3.0000 g | INTRAVENOUS | Status: DC
  Administered 2024-08-14 – 2024-08-15 (×2): 3 g via INTRAVENOUS
  Filled 2024-08-13 (×2): qty 8

## 2024-08-13 MED ORDER — SODIUM BICARBONATE 8.4 % IV SOLN
100.0000 meq | Freq: Once | INTRAVENOUS | Status: AC
  Administered 2024-08-13: 100 meq via INTRAVENOUS
  Filled 2024-08-13: qty 100

## 2024-08-13 NOTE — Progress Notes (Signed)
 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 Cultures GPC Clusters. On unasyn /vanc.   O: Intubated, sedated. Dressings with serosanguinous output. Penroses intact  A/P: POD# 1 s/p I&D Deep space neck abscess. Doing well, afebrile WBC trending down  Continue IV ABX for at least 7 days pending treatment response,f/u cultures. Total antibiotics recommended at least ~2 weeks.  Consider ID Consult given severity of infection.  Penrose drains may be removed POD#3-5 pending exam ENT TO follow  Dispo: ICU  Electronically signed by:  Elspeth Coddington, MD  Staff Physician Facial Plastic & Reconstructive Surgery Otolaryngology - Head and Neck Surgery [facility] [place]

## 2024-08-13 NOTE — Progress Notes (Signed)
 LTM EEG hooked up and running - no initial skin breakdown - push button tested - [facility] monitoring.

## 2024-08-13 NOTE — Progress Notes (Addendum)
 Pharmacy Antibiotic Note  Kelly Chambers is a 73 y.o. female admitted on 08/12/2024 with sepsis.  Pharmacy has been consulted for Vancomycin  dosing.  Pt received 1250mg  IV load ~1430  8/23: Pt with CKD stage III, Scr 3.51 with est CrCl 9 ml/min. WBC decreased to 27.0, Lactate 2.6. MRSA nares was positive, blood cultures remain no growth to date. Abscess culture with abundant gram positive cocci in clusters.   Plan: Decrease Unasyn  to 3g IV q24h  Check vancomycin  random level on 8/24 AM (~36 hours after dose) Will f/u renal function, micro data, and pt's clinical condition  Height: 4' 9 (144.8 cm) Weight: 42.1 kg (92 lb 13 oz) IBW/kg (Calculated) : 38.6  Temp (24hrs), Avg:98.4 F (36.9 C), Min:95.4 F (35.2 C), Max:100.8 F (38.2 C)  Recent Labs  Lab 08/12/24 1012 08/12/24 1014 08/12/24 1026 08/12/24 1030 08/12/24 1210 08/12/24 1212 08/12/24 1514 08/13/24 0049 08/13/24 0348 08/13/24 1021 08/13/24 1241  WBC 47.7*  --   --   --   --   --   --   --  27.9*  --   --   CREATININE  --  3.61*  --  3.10*  --  3.25*  --   --  3.51*  --   --   LATICACIDVEN  --   --    < >  --    < >  --  4.6* 3.4* 3.3* 2.2* 2.6*   < > = values in this interval not displayed.    Estimated Creatinine Clearance: 9 mL/min (A) (by C-G formula based on SCr of 3.51 mg/dL (H)).    Allergies  Allergen Reactions   Penicillins Itching    Skin itching    Antimicrobials this admission: 8/22 Unasyn  >>  8/22 Vanc >>  8/22 Cefepime  x 1 8/22 Azithromycin  x 1  Microbiology results: 8/22 BCx:  8/22 MRSA PCR: positive  Thank you for allowing pharmacy to be a part of this patient's care.  Morna Breach, PharmD PGY2 Cardiology Pharmacy Resident 08/13/2024 1:36 PM

## 2024-08-13 NOTE — Progress Notes (Signed)
 NAME:  Kelly Chambers, MRN:  [id number], DOB:  February 15, 1951, LOS: 1 ADMISSION DATE:  08/12/2024, CONSULTATION DATE: 08/12/2024 REFERRING MD: Emergency department physician, CHIEF COMPLAINT: Status post PEA arrest  History of Present Illness:  73 year old female with a extensive past medical history O2 dependent but does not wear her oxygen  she had a witnessed arrest was found to be in PEA required approximately 7 minutes of CPR for return of spontaneous circulation's.  She is now intubated and is respiratory rates been increased to 30 due to PCO2 of 84 and a pH of 6.89.   Pulmonary critical care has been called to evaluate and admit to the intensive care unit.  Pertinent  Medical History   Past Medical History:  Diagnosis Date   Allergy    hay fever   Anemia    Anxiety    Blood transfusion without reported diagnosis    c section   Cataract    no surgery   Clotting disorder (HCC)    COPD (chronic obstructive pulmonary disease) (HCC)    Depression    Emphysema of lung (HCC)    GERD (gastroesophageal reflux disease)    Hyperlipidemia    Hypertension    Orthostatic hypotension    Osteoarthritis    back hips shoulders and hands   Ovarian cancer (HCC)    Renal failure, chronic, stage 3 (moderate) (HCC) 06/26/2017   Stroke (HCC)    Syncope and collapse    Thyroid  disease      Significant Hospital Events: Including procedures, antibiotic start and stop dates in addition to other pertinent events   8/22 admitted status post PEA cardiac arrest, noted to have large Ludewig angina, underwent incision and drainage of deep space neck abscess by ENT  Interim History / Subjective:  Patient came off of vasopressor support Not making much urine, remained oliguric Afebrile, white count is trending down  Objective    Blood pressure 138/60, pulse 95, temperature 99.7 F (37.6 C), resp. rate (!) 35, height 4' 9 (1.448 m), weight 42.1 kg, SpO2 96%. CVP:  [5 mmHg-11 mmHg] 5 mmHg  Vent Mode:  PRVC FiO2 (%):  [70 %-100 %] 80 % Set Rate:  [26 bmp-35 bmp] 35 bmp Vt Set:  [310 mL-350 mL] 350 mL PEEP:  [5 cmH20-10 cmH20] 10 cmH20 Plateau Pressure:  [21 cmH20-28 cmH20] 25 cmH20   Intake/Output Summary (Last 24 hours) at 08/13/2024 0925 Last data filed at 08/13/2024 0800 Gross per 24 hour  Intake 5539.49 ml  Output 145 ml  Net 5394.49 ml   Filed Weights   08/12/24 1300 08/12/24 2215 08/13/24 0310  Weight: 42 kg 42 kg 42.1 kg    Examination: General: Crtitically ill-appearing elderly female, orally intubated HEENT: [place]/AT, eyes anicteric.  ETT and OGT in place Neuro: Eyes closed, does not open, not following commands, pupils bilateral reactive to light, absent cough and gag Chest: Coarse breath sounds, no wheezes or rhonchi Heart: Regular rate and rhythm, no murmurs or gallops Abdomen: Soft, nondistended, bowel sounds present  Labs and images reviewed  Patient Lines/Drains/Airways Status     Active Line/Drains/Airways     Name Placement date Placement time Site Days   Arterial Line 08/12/24 Left Other (Comment) 08/12/24  1400  Other (Comment)  1   Peripheral IV 08/12/24 18 G Left Antecubital 08/12/24  1005  Antecubital  1   Peripheral IV 08/12/24 20 G Right Antecubital 08/12/24  1006  Antecubital  1   Peripheral IV 08/12/24 22 G Anterior;Left  Forearm 08/12/24  1152  Forearm  1   CVC Triple Lumen 08/12/24 Left Subclavian 08/12/24  1400  -- 1   Open Drain 1 Right Neck 08/12/24  2024  Neck  1   Open Drain 2 Left Neck  08/12/24  2029  Neck  1   NG/OG Vented/Dual Lumen 18 Fr. Oral 62 cm 08/12/24  1017  Oral  1   Urethral Catheter Andrew NT 141 Fr. 08/12/24  1040  --  1   Airway 7.5 mm 08/12/24  1019  -- 1   Wound 08/12/24 2031 Surgical Closed Surgical Incision Neck 08/12/24  2031  Neck  1        Resolved problem list   Assessment and Plan  Status post PEA cardiac arrest likely due to hypoxia Postarrest myoclonus Severe sepsis with septic shock due to large Ludwig  angina/neck abscess status post I&D and drainage of abscess by ENT Acute on chronic hypoxic/hypercapnic respiratory failure Bilateral multifocal pneumonia Mixed metabolic and respiratory acidosis Acute septic encephalopathy COPD, not exacerbation Right upper lobe lung mass AKI on CKD stage IIIa due to septic ATN Hyponatremia/hypocalcemia Leukemoid reaction due to severe sepsis, improving Anemia of critical illness  Continue TTM Continue supportive care Continue telemetry monitoring Upon admission patient had visible myoclonus, no clinical myoclonus now Awaiting EEG Came off of vasopressor support Underwent incision and drainage of neck abscess overnight by ENT Surgical culture is growing gram-positive cocci in cluster Continue IV vancomycin  and Unasyn  Decrease stress dose steroid to once daily Continue lung protective ventilation Vent setting was adjusted to clear hypercapnia Follow-up respiratory culture Trend lactate Minimize sedation with RASS goal -2/-3 Currently on propofol , will start fentanyl  Outpatient follow-up with pulmonary She remained oligoanuric, serum creatinine trended up to 3.5 Monitor intake and output Will do Lasix  trial Monitor electrolytes and supplement as needed White count is trending down Monitor H&H  Prognosis guarded    The patient is critically ill due to severe sepsis with septic shock/status post PEA cardiac arrest.  Critical care was necessary to treat or prevent imminent or life-threatening deterioration.  Critical care was time spent personally by me on the following activities: development of treatment plan with patient and/or surrogate as well as nursing, discussions with consultants, evaluation of patient's response to treatment, examination of patient, obtaining history from patient or surrogate, ordering and performing treatments and interventions, ordering and review of laboratory studies, ordering and review of radiographic studies,  pulse oximetry, re-evaluation of patient's condition and participation in multidisciplinary rounds.   During this encounter critical care time was devoted to patient care services described in this note for 47 minutes.     Valinda Novas, MD  Pulmonary Critical Care See Amion for pager If no response to pager, please call [phone] until 7pm After 7pm, Please call E-link [phone]

## 2024-08-14 ENCOUNTER — Inpatient Hospital Stay (HOSPITAL_COMMUNITY)

## 2024-08-14 DIAGNOSIS — J9622 Acute and chronic respiratory failure with hypercapnia: Secondary | ICD-10-CM | POA: Diagnosis not present

## 2024-08-14 DIAGNOSIS — J189 Pneumonia, unspecified organism: Secondary | ICD-10-CM | POA: Diagnosis not present

## 2024-08-14 DIAGNOSIS — J9621 Acute and chronic respiratory failure with hypoxia: Secondary | ICD-10-CM | POA: Diagnosis not present

## 2024-08-14 DIAGNOSIS — I469 Cardiac arrest, cause unspecified: Secondary | ICD-10-CM | POA: Diagnosis not present

## 2024-08-14 DIAGNOSIS — R569 Unspecified convulsions: Secondary | ICD-10-CM

## 2024-08-14 LAB — POCT I-STAT 7, (LYTES, BLD GAS, ICA,H+H)
Acid-Base Excess: 0 mmol/L (ref 0.0–2.0)
Acid-Base Excess: 0 mmol/L (ref 0.0–2.0)
Bicarbonate: 28.1 mmol/L — ABNORMAL HIGH (ref 20.0–28.0)
Bicarbonate: 26 mmol/L (ref 20.0–28.0)
Calcium, Ion: 0.96 mmol/L — ABNORMAL LOW (ref 1.15–1.40)
Calcium, Ion: 0.94 mmol/L — ABNORMAL LOW (ref 1.15–1.40)
HCT: 30 % — ABNORMAL LOW (ref 36.0–46.0)
HCT: 27 % — ABNORMAL LOW (ref 36.0–46.0)
Hemoglobin: 10.2 g/dL — ABNORMAL LOW (ref 12.0–15.0)
Hemoglobin: 9.2 g/dL — ABNORMAL LOW (ref 12.0–15.0)
O2 Saturation: 95 %
O2 Saturation: 98 %
Patient temperature: 37.1
Patient temperature: 36.7
Potassium: 4.2 mmol/L (ref 3.5–5.1)
Potassium: 3.9 mmol/L (ref 3.5–5.1)
Sodium: 134 mmol/L — ABNORMAL LOW (ref 135–145)
Sodium: 132 mmol/L — ABNORMAL LOW (ref 135–145)
TCO2: 30 mmol/L (ref 22–32)
TCO2: 28 mmol/L (ref 22–32)
pCO2 arterial: 66.3 mmHg (ref 32–48)
pCO2 arterial: 49.9 mmHg — ABNORMAL HIGH (ref 32–48)
pH, Arterial: 7.236 — ABNORMAL LOW (ref 7.35–7.45)
pH, Arterial: 7.324 — ABNORMAL LOW (ref 7.35–7.45)
pO2, Arterial: 92 mmHg (ref 83–108)
pO2, Arterial: 111 mmHg — ABNORMAL HIGH (ref 83–108)

## 2024-08-14 LAB — COMPREHENSIVE METABOLIC PANEL WITH GFR
ALT: 432 U/L — ABNORMAL HIGH (ref 0–44)
AST: 428 U/L — ABNORMAL HIGH (ref 15–41)
Albumin: 1.5 g/dL — ABNORMAL LOW (ref 3.5–5.0)
Alkaline Phosphatase: 140 U/L — ABNORMAL HIGH (ref 38–126)
Anion gap: 13 (ref 5–15)
BUN: 83 mg/dL — ABNORMAL HIGH (ref 8–23)
CO2: 26 mmol/L (ref 22–32)
Calcium: 7.1 mg/dL — ABNORMAL LOW (ref 8.9–10.3)
Chloride: 96 mmol/L — ABNORMAL LOW (ref 98–111)
Creatinine, Ser: 4.45 mg/dL — ABNORMAL HIGH (ref 0.44–1.00)
GFR, Estimated: 10 mL/min — ABNORMAL LOW (ref >60)
Glucose, Bld: 134 mg/dL — ABNORMAL HIGH (ref 70–99)
Potassium: 4 mmol/L (ref 3.5–5.1)
Sodium: 135 mmol/L (ref 135–145)
Total Bilirubin: 0.8 mg/dL (ref 0.0–1.2)
Total Protein: 5.1 g/dL — ABNORMAL LOW (ref 6.5–8.1)

## 2024-08-14 LAB — GLUCOSE, CAPILLARY
Glucose-Capillary: 105 mg/dL — ABNORMAL HIGH (ref 70–99)
Glucose-Capillary: 115 mg/dL — ABNORMAL HIGH (ref 70–99)
Glucose-Capillary: 117 mg/dL — ABNORMAL HIGH (ref 70–99)
Glucose-Capillary: 127 mg/dL — ABNORMAL HIGH (ref 70–99)
Glucose-Capillary: 135 mg/dL — ABNORMAL HIGH (ref 70–99)
Glucose-Capillary: 142 mg/dL — ABNORMAL HIGH (ref 70–99)

## 2024-08-14 LAB — CBC WITH DIFFERENTIAL/PLATELET
Abs Immature Granulocytes: 2.98 K/uL — ABNORMAL HIGH (ref 0.00–0.07)
Basophils Absolute: 0 K/uL (ref 0.0–0.1)
Basophils Relative: 0 %
Eosinophils Absolute: 0 K/uL (ref 0.0–0.5)
Eosinophils Relative: 0 %
HCT: 29.2 % — ABNORMAL LOW (ref 36.0–46.0)
Hemoglobin: 9.8 g/dL — ABNORMAL LOW (ref 12.0–15.0)
Immature Granulocytes: 6 %
Lymphocytes Relative: 2 %
Lymphs Abs: 1 K/uL (ref 0.7–4.0)
MCH: 33.1 pg (ref 26.0–34.0)
MCHC: 33.6 g/dL (ref 30.0–36.0)
MCV: 98.6 fL (ref 80.0–100.0)
Monocytes Absolute: 1.2 K/uL — ABNORMAL HIGH (ref 0.1–1.0)
Monocytes Relative: 3 %
Neutro Abs: 41.8 K/uL — ABNORMAL HIGH (ref 1.7–7.7)
Neutrophils Relative %: 89 %
Platelets: 290 K/uL (ref 150–400)
RBC: 2.96 MIL/uL — ABNORMAL LOW (ref 3.87–5.11)
RDW: 13.1 % (ref 11.5–15.5)
Smear Review: NORMAL
WBC: 47 K/uL — ABNORMAL HIGH (ref 4.0–10.5)
nRBC: 0.5 % — ABNORMAL HIGH (ref 0.0–0.2)

## 2024-08-14 LAB — MAGNESIUM: Magnesium: 2.3 mg/dL (ref 1.7–2.4)

## 2024-08-14 LAB — PHOSPHORUS: Phosphorus: 6.4 mg/dL — ABNORMAL HIGH (ref 2.5–4.6)

## 2024-08-14 LAB — CG4 I-STAT (LACTIC ACID): Lactic Acid, Venous: 1.2 mmol/L (ref 0.5–1.9)

## 2024-08-14 LAB — VANCOMYCIN, RANDOM: Vancomycin Rm: 19 ug/mL

## 2024-08-14 MED ORDER — VANCOMYCIN HCL IN DEXTROSE 1-5 GM/200ML-% IV SOLN
1000.0000 mg | Freq: Once | INTRAVENOUS | Status: AC
  Administered 2024-08-14: 1000 mg via INTRAVENOUS
  Filled 2024-08-14: qty 200

## 2024-08-14 MED ORDER — LABETALOL HCL 5 MG/ML IV SOLN: 10.0000 mg | INTRAVENOUS | Status: DC | PRN

## 2024-08-14 MED ORDER — IOHEXOL 350 MG/ML SOLN
75.0000 mL | Freq: Once | INTRAVENOUS | Status: AC | PRN
  Administered 2024-08-14: 100 mL via INTRAVENOUS

## 2024-08-14 MED ORDER — DEXTROSE IN LACTATED RINGERS 5 % IV SOLN: INTRAVENOUS | Status: DC

## 2024-08-14 NOTE — Progress Notes (Addendum)
 Transported to and from CT w/o complications

## 2024-08-14 NOTE — Progress Notes (Signed)
 NAME:  Kelly Chambers, MRN:  [id number], DOB:  1951-05-06, LOS: 2 ADMISSION DATE:  08/12/2024, CONSULTATION DATE: 08/12/2024 REFERRING MD: Emergency department physician, CHIEF COMPLAINT: Status post PEA arrest  History of Present Illness:  73 year old female with a extensive past medical history O2 dependent but does not wear her oxygen  she had a witnessed arrest was found to be in PEA required approximately 7 minutes of CPR for return of spontaneous circulation's.  She is now intubated and is respiratory rates been increased to 30 due to PCO2 of 84 and a pH of 6.89.   Pulmonary critical care has been called to evaluate and admit to the intensive care unit.  Pertinent  Medical History   Past Medical History:  Diagnosis Date   Allergy    hay fever   Anemia    Anxiety    Blood transfusion without reported diagnosis    c section   Cataract    no surgery   Clotting disorder (HCC)    COPD (chronic obstructive pulmonary disease) (HCC)    Depression    Emphysema of lung (HCC)    GERD (gastroesophageal reflux disease)    Hyperlipidemia    Hypertension    Orthostatic hypotension    Osteoarthritis    back hips shoulders and hands   Ovarian cancer (HCC)    Renal failure, chronic, stage 3 (moderate) (HCC) 06/26/2017   Stroke (HCC)    Syncope and collapse    Thyroid  disease      Significant Hospital Events: Including procedures, antibiotic start and stop dates in addition to other pertinent events   8/22 admitted status post PEA cardiac arrest, noted to have large Ludewig angina, underwent incision and drainage of deep space neck abscess by ENT 8/23 came off of vasopressor support, serum creatinine continue to rise, remain oligoanuric  Interim History / Subjective:  Patient remained afebrile, white count went up from 27 back to 47 Remain off vasopressor support Started making more urine even though serum creatinine trended up  Objective    Blood pressure (!) 145/68, pulse 95,  temperature 98.2 F (36.8 C), resp. rate (!) 32, height 4' 9 (1.448 m), weight 57.6 kg, SpO2 95%. CVP:  [1 mmHg-76 mmHg] 8 mmHg  Vent Mode: PRVC FiO2 (%):  [80 %] 80 % Set Rate:  [32 bmp] 32 bmp Vt Set:  [360 mL] 360 mL PEEP:  [10 cmH20] 10 cmH20 Plateau Pressure:  [20 cmH20-25 cmH20] 24 cmH20   Intake/Output Summary (Last 24 hours) at 08/14/2024 0947 Last data filed at 08/14/2024 0800 Gross per 24 hour  Intake 1691 ml  Output 755 ml  Net 936 ml   Filed Weights   08/12/24 2215 08/13/24 0310 08/14/24 0436  Weight: 42 kg 42.1 kg 57.6 kg    Examination: General: Crtitically ill-appearing elderly female, orally intubated HEENT: [place]/AT, eyes anicteric.  ETT and OGT in place Neuro: Sedated, not following commands.  Eyes are closed.  Pupils 3 mm bilateral reactive to light, weak cough present Chest: Coarse breath sounds, no wheezes or rhonchi Heart: Regular rate and rhythm, no murmurs or gallops Abdomen: Soft, nondistended, bowel sounds present   Labs and images reviewed  Patient Lines/Drains/Airways Status     Active Line/Drains/Airways     Name Placement date Placement time Site Days   Arterial Line 08/12/24 Left Other (Comment) 08/12/24  1400  Other (Comment)  2   Peripheral IV 08/12/24 18 G Left Antecubital 08/12/24  1005  Antecubital  2   Peripheral  IV 08/12/24 20 G Right Antecubital 08/12/24  1006  Antecubital  2   Peripheral IV 08/12/24 22 G Anterior;Left Forearm 08/12/24  1152  Forearm  2   CVC Triple Lumen 08/12/24 Left Subclavian 08/12/24  1400  -- 2   Open Drain 1 Right Neck 08/12/24  2024  Neck  2   Open Drain 2 Left Neck  08/12/24  2029  Neck  2   NG/OG Vented/Dual Lumen 18 Fr. Oral 62 cm 08/12/24  1017  Oral  2   Urethral Catheter Andrew NT 141 Fr. 08/12/24  1040  --  2   Airway 7.5 mm 08/12/24  1019  -- 2   Wound 08/12/24 2031 Surgical Closed Surgical Incision Neck 08/12/24  2031  Neck  2        Resolved problem list  Septic shock  Assessment and Plan   Status post PEA cardiac arrest likely due to hypoxia Postarrest myoclonus, resolved Severe sepsis with septic shock due to large Ludwig angina/neck abscess status post I&D and drainage of abscess by ENT Acute on chronic hypoxic/hypercapnic respiratory failure Bilateral multifocal pneumonia Mixed metabolic and respiratory acidosis Acute septic encephalopathy COPD, not exacerbation Right upper lobe lung mass AKI on CKD stage IIIa due to septic ATN Hyponatremia/hypocalcemia Leukemoid reaction due to severe sepsis Anemia of critical illness  Continue supportive care Continue telemetry monitoring, so far no changes Upon admission patient had visible myoclonus, now it has resolved EEG showed diffuse encephalopathy Patient is off vasopressors Underwent incision and drainage of neck abscess Surgical culture is growing Staph aureus, MSSA versus MRSA, sensitivities pending Continue IV vancomycin  and Unasyn  Discontinue stress dose steroids Repeat CT head/neck/chest pending ENT is following Continue lung protective ventilation Vent setting was adjusted to clear hypercapnia Still remained hypoxic and hypercapnic No respiratory secretions Lactate was 2.6, repeat pending Continue propofol  and fentanyl  with RASS goal -2 Outpatient follow-up with pulmonary to further evaluate lung nodule Serum creatinine trended up, patient started making more urine Electrolytes are within normal limits Will hold off calling nephrology for now Monitor intake and output Monitor electrolytes and supplement as needed White count went up again, likely in the setting of steroids Monitor H&H  Prognosis guarded 8/22: Patient husband was updated at bedside    The patient is critically ill due to severe sepsis with septic shock/status post PEA cardiac arrest.  Critical care was necessary to treat or prevent imminent or life-threatening deterioration.  Critical care was time spent personally by me on the  following activities: development of treatment plan with patient and/or surrogate as well as nursing, discussions with consultants, evaluation of patient's response to treatment, examination of patient, obtaining history from patient or surrogate, ordering and performing treatments and interventions, ordering and review of laboratory studies, ordering and review of radiographic studies, pulse oximetry, re-evaluation of patient's condition and participation in multidisciplinary rounds.   During this encounter critical care time was devoted to patient care services described in this note for 45 minutes.     Valinda Novas, MD [place] [facility] See Amion for pager If no response to pager, please call [phone] until 7pm After 7pm, Please call E-link [phone]

## 2024-08-14 NOTE — Progress Notes (Signed)
 LTM VIDEO EEG discontinued - no skin breakdown at [facility].

## 2024-08-14 NOTE — Progress Notes (Signed)
 CT head reviewed myself  Showing loss of gray-white differentiation and diffuse cerebral edema, consistent with severe anoxic brain injury  Patient's husband was called and informed, he would like to have family meeting tomorrow for possible withdrawal of care   Valinda Novas, MD

## 2024-08-14 NOTE — Plan of Care (Signed)
  Problem: Education: Goal: Ability to describe self-care measures that may prevent or decrease complications (Diabetes Survival Skills Education) will improve Outcome: Not Progressing Goal: Individualized Educational Video(s) Outcome: Not Progressing   Problem: Coping: Goal: Ability to adjust to condition or change in health will improve Outcome: Not Progressing   Problem: Fluid Volume: Goal: Ability to maintain a balanced intake and output will improve Outcome: Progressing   Problem: Health Behavior/Discharge Planning: Goal: Ability to identify and utilize available resources and services will improve Outcome: Not Progressing Goal: Ability to manage health-related needs will improve Outcome: Not Progressing   Problem: Metabolic: Goal: Ability to maintain appropriate glucose levels will improve Outcome: Progressing   Problem: Nutritional: Goal: Maintenance of adequate nutrition will improve Outcome: Not Progressing Goal: Progress toward achieving an optimal weight will improve Outcome: Progressing   Problem: Skin Integrity: Goal: Risk for impaired skin integrity will decrease Outcome: Progressing   Problem: Tissue Perfusion: Goal: Adequacy of tissue perfusion will improve Outcome: Progressing   Problem: Education: Goal: Knowledge of General Education information will improve Description: Including pain rating scale, medication(s)/side effects and non-pharmacologic comfort measures Outcome: Not Progressing   Problem: Health Behavior/Discharge Planning: Goal: Ability to manage health-related needs will improve Outcome: Not Progressing   Problem: Clinical Measurements: Goal: Ability to maintain clinical measurements within normal limits will improve Outcome: Progressing Goal: Will remain free from infection Outcome: Progressing Goal: Diagnostic test results will improve Outcome: Progressing Goal: Respiratory complications will improve Outcome: Progressing Goal:  Cardiovascular complication will be avoided Outcome: Progressing   Problem: Activity: Goal: Risk for activity intolerance will decrease Outcome: Progressing   Problem: Nutrition: Goal: Adequate nutrition will be maintained Outcome: Not Progressing   Problem: Coping: Goal: Level of anxiety will decrease Outcome: Progressing   Problem: Elimination: Goal: Will not experience complications related to bowel motility Outcome: Not Progressing Goal: Will not experience complications related to urinary retention Outcome: Progressing   Problem: Pain Managment: Goal: General experience of comfort will improve and/or be controlled Outcome: Progressing   Problem: Safety: Goal: Ability to remain free from injury will improve Outcome: Progressing   Problem: Skin Integrity: Goal: Risk for impaired skin integrity will decrease Outcome: Progressing

## 2024-08-14 NOTE — Progress Notes (Signed)
 eLink Physician-Brief Progress Note Patient Name: Kelly Chambers DOB: 1951/01/28 MRN: [id number]   Date of Service  08/14/2024  HPI/Events of Note  Mild hypertension  eICU Interventions  Add as needed labetalol      Intervention Category Major Interventions: Hypertension - evaluation and management  Bernard Donahoo 08/14/2024, 3:31 AM

## 2024-08-14 NOTE — Progress Notes (Signed)
 LTM maint complete - no skin breakdown seen .  Serviced leads. [facility] monitored, Event button test confirmed by [facility].

## 2024-08-14 NOTE — Progress Notes (Signed)
 ENT UPDATE NOTE  Reviewed CT imaging from earlier today.  Findings suggest possible persistent vs. Recurrent infection/abscess.  Discussed findings with ICU attending Dr. Maree.  Given the patient's very poor neurologic prognosis and possible plan for withdrawal of care tomorrow Monday, 2025-09-12takeback surgery for washout of the patient's neck is likely to have a limited value/benefit for the patient.  Dr. Maree discussed this with the patient's husband who agreed to defer further surgery for now given plan for making patient comfort care tomorrow.  I find this plan to be reasonable to be more conservative in managing the neck given guarded neurologic prognosis and goals of care.  ENT available to support the patient and primary team in any way needed moving forward.  Electronically signed by:  Elspeth Coddington, MD  Staff Physician Facial Plastic & Reconstructive Surgery Otolaryngology - Head and Neck Surgery [facility] [place]

## 2024-08-14 NOTE — Progress Notes (Signed)
 Progress Note  I had a conversation with ENT Dr Luciano about patient and possible need to return to OR unless husband is leaning towards withdrawal of care.  I called the husband and spoke to him and given the current situation and CT head showing anoxic brain injury following cardiac arrest.  Patient has no gag or cough and is is not responsive to tactile stimuli, as well as AKI, shock liver, poor prognosis.  The husband would like to make her DNR tonight and then will be coming in the morning and discuss if there are any improvements and otherwise remove from mechanical vent and terminally wean. Orlin Fairly, MD 8/24 9:19 pm

## 2024-08-14 NOTE — Progress Notes (Signed)
 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 Limited activity on EEG (Diffuse encephalopathy). Not requiring pressors. On Vanc/unasyn . Cultures with S. Aureus.    O: Intubated, sedated. Dressings with serosanguinous output. Penroses intact - loosened both and performed bimanual palpation of floor of mouth - soft, no sign of recurrent abscess.   Cultures:  Component Ref Range & Units (hover) 2 d ago  Specimen Description ABSCESS  Special Requests FLOOR OF MOUTH, UNASYN   Gram Stain NO WBC SEEN ABUNDANT GRAM POSITIVE COCCI IN CLUSTERS  Culture MODERATE STAPHYLOCOCCUS AUREUS SUSCEPTIBILITIES TO FOLLOW Performed at [facility], [address] [place], [place] [address]  Report Status PENDING    A/P: POD# 2 s/p I&D Deep space neck abscess. Clinical course tenuous with rising WBC. Low suspicion for surgical site recurrence of abscess however repeat head, neck and chest imaging may be beneficial to rule out spread I.e. descending to chest.   Penrose drains remain for several more days until infectious markers improve Primary team consider repeat imaging to assess for progressive head/neck/chest infections ENT To follow closely   Dispo: ICU  Electronically signed by:  Elspeth Coddington, MD  Staff Physician Facial Plastic & Reconstructive Surgery Otolaryngology - Head and Neck Surgery [facility] [place]

## 2024-08-14 NOTE — Progress Notes (Signed)
 Pharmacy Antibiotic Note  Kelly Chambers is a 73 y.o. female admitted on 08/12/2024 with sepsis.  Pharmacy has been consulted for Vancomycin  dosing.  Pt received 1250mg  IV load ~1430  8/24: Pt with CKD stage III, Scr 4.45 with est CrCl 8.5 ml/min. WBC increased to 24.0, Lactate 1.2. MRSA nares was positive, blood cultures remain no growth to date. Abscess culture with moderate MRSA. Discussed with MD, plan to continue both Unasyn  and vancomycin  due to facial abscess. Vancomycin  level 19 today, within goal trough range of 15-20. Will re-dose vancomycin  and recheck level in 24 hours  Plan: Continue Unasyn  to 3g IV q24h  Vancomycin  1000 mg x1 Check vancomycin  random level on September 10, 2024 AM (~24 hours after dose) Will f/u renal function, micro data, and pt's clinical condition  Height: 4' 9 (144.8 cm) Weight: 57.6 kg (126 lb 15.8 oz) (Weighed with 1 bed pad, 1 pillow, 1 bottom sheet, 1 top sheet, and 1 big brown blanket. SCD machine off the bed, Foley bag off the bed, and EEG device off the bed) IBW/kg (Calculated) : 38.6  Temp (24hrs), Avg:98.4 F (36.9 C), Min:97.2 F (36.2 C), Max:99.3 F (37.4 C)  Recent Labs  Lab 08/12/24 1012 08/12/24 1014 08/12/24 1026 08/12/24 1030 08/12/24 1210 08/12/24 1212 08/12/24 1514 08/13/24 0049 08/13/24 0348 08/13/24 1021 08/13/24 1241 08/14/24 0514 08/14/24 1021  WBC 47.7*  --   --   --   --   --   --   --  27.9*  --   --  47.0*  --   CREATININE  --  3.61*  --  3.10*  --  3.25*  --   --  3.51*  --   --  4.45*  --   LATICACIDVEN  --   --    < >  --    < >  --    < > 3.4* 3.3* 2.2* 2.6*  --  1.2  VANCORANDOM  --   --   --   --   --   --   --   --   --   --   --  19  --    < > = values in this interval not displayed.    Estimated Creatinine Clearance: 8.5 mL/min (A) (by C-G formula based on SCr of 4.45 mg/dL (H)).    Allergies  Allergen Reactions   Trazodone  Anaphylaxis    Syncope   Penicillins Itching    Skin itching    Antimicrobials this  admission: 8/22 Unasyn  >>  8/22 Vanc >>  8/22 Cefepime  x 1 8/22 Azithromycin  x 1  Microbiology results: 8/22 BCx: ngtd 8/22 mouth culture: MRSA 8/22 MRSA PCR: positive  Thank you for allowing pharmacy to be a part of this patient's care.  Morna Breach, PharmD PGY2 Cardiology Pharmacy Resident 08/14/2024 1:34 PM

## 2024-08-14 NOTE — Procedures (Addendum)
 Patient Name: Kelly Chambers  MRN: [id number]  Epilepsy Attending: Arlin MALVA Krebs  Referring Physician/Provider: Harold Scholz, MD  Duration: 08/13/2024 1140 to 08/14/2024 1309  Patient history: 73yo F s/p cardiac arrest. EEG to evaluate for seizure  Level of alertness: comatose  AEDs during EEG study: Propofol   Technical aspects: This EEG study was done with scalp electrodes positioned according to the 10-20 International system of electrode placement. Electrical activity was reviewed with band pass filter of 1-70Hz , sensitivity of 7 uV/mm, display speed of 83mm/sec with a 60Hz  notched filter applied as appropriate. EEG data were recorded continuously and digitally stored.  Video monitoring was available and reviewed as appropriate.  Description: EEG showed burst suppression pattern with high amplitude bursts of 3 to 6 Hz theta-delta slowing lasting 2-7 seconds alternating with 3-11 seconds of generalized suppression. Gradually the eeg evolved and showed generalized attenuation. Hyperventilation and photic stimulation were not performed.     ABNORMALITY - Burst suppression, generalized  IMPRESSION: This study is suggestive of profound diffuse encephalopathy. No seizures or epileptiform discharges were seen throughout the recording.  Malcolm Quast O Quintel Mccalla

## 2024-08-15 ENCOUNTER — Encounter (HOSPITAL_COMMUNITY): Payer: Self-pay | Admitting: Otolaryngology

## 2024-08-15 DIAGNOSIS — Z9911 Dependence on respirator [ventilator] status: Secondary | ICD-10-CM

## 2024-08-15 DIAGNOSIS — Z515 Encounter for palliative care: Secondary | ICD-10-CM

## 2024-08-15 DIAGNOSIS — A419 Sepsis, unspecified organism: Secondary | ICD-10-CM | POA: Diagnosis not present

## 2024-08-15 DIAGNOSIS — R6521 Severe sepsis with septic shock: Secondary | ICD-10-CM | POA: Diagnosis not present

## 2024-08-15 DIAGNOSIS — B49 Unspecified mycosis: Secondary | ICD-10-CM | POA: Insufficient documentation

## 2024-08-15 DIAGNOSIS — R7401 Elevation of levels of liver transaminase levels: Secondary | ICD-10-CM

## 2024-08-15 DIAGNOSIS — Z66 Do not resuscitate: Secondary | ICD-10-CM | POA: Insufficient documentation

## 2024-08-15 DIAGNOSIS — G253 Myoclonus: Secondary | ICD-10-CM | POA: Insufficient documentation

## 2024-08-15 DIAGNOSIS — K122 Cellulitis and abscess of mouth: Secondary | ICD-10-CM | POA: Insufficient documentation

## 2024-08-15 DIAGNOSIS — E871 Hypo-osmolality and hyponatremia: Secondary | ICD-10-CM

## 2024-08-15 DIAGNOSIS — I469 Cardiac arrest, cause unspecified: Secondary | ICD-10-CM | POA: Diagnosis not present

## 2024-08-15 DIAGNOSIS — G931 Anoxic brain damage, not elsewhere classified: Secondary | ICD-10-CM | POA: Insufficient documentation

## 2024-08-15 DIAGNOSIS — Z7189 Other specified counseling: Secondary | ICD-10-CM

## 2024-08-15 DIAGNOSIS — J189 Pneumonia, unspecified organism: Secondary | ICD-10-CM | POA: Diagnosis not present

## 2024-08-15 DIAGNOSIS — A4902 Methicillin resistant Staphylococcus aureus infection, unspecified site: Secondary | ICD-10-CM | POA: Insufficient documentation

## 2024-08-15 LAB — BLOOD CULTURE ID PANEL (REFLEXED) - BCID2

## 2024-08-15 LAB — GLUCOSE, CAPILLARY
Glucose-Capillary: 111 mg/dL — ABNORMAL HIGH (ref 70–99)
Glucose-Capillary: 102 mg/dL — ABNORMAL HIGH (ref 70–99)
Glucose-Capillary: 98 mg/dL (ref 70–99)

## 2024-08-15 LAB — CBC
HCT: 25.9 % — ABNORMAL LOW (ref 36.0–46.0)
Hemoglobin: 9.1 g/dL — ABNORMAL LOW (ref 12.0–15.0)
MCH: 33.6 pg (ref 26.0–34.0)
MCHC: 35.1 g/dL (ref 30.0–36.0)
MCV: 95.6 fL (ref 80.0–100.0)
Platelets: 272 K/uL (ref 150–400)
RBC: 2.71 MIL/uL — ABNORMAL LOW (ref 3.87–5.11)
RDW: 13.2 % (ref 11.5–15.5)
WBC: 67.9 K/uL (ref 4.0–10.5)
nRBC: 0.2 % (ref 0.0–0.2)

## 2024-08-15 LAB — BASIC METABOLIC PANEL WITH GFR
Anion gap: 11 (ref 5–15)
BUN: 95 mg/dL — ABNORMAL HIGH (ref 8–23)
CO2: 24 mmol/L (ref 22–32)
Calcium: 7 mg/dL — ABNORMAL LOW (ref 8.9–10.3)
Chloride: 98 mmol/L (ref 98–111)
Creatinine, Ser: 5.18 mg/dL — ABNORMAL HIGH (ref 0.44–1.00)
GFR, Estimated: 8 mL/min — ABNORMAL LOW (ref >60)
Glucose, Bld: 120 mg/dL — ABNORMAL HIGH (ref 70–99)
Potassium: 3.7 mmol/L (ref 3.5–5.1)
Sodium: 133 mmol/L — ABNORMAL LOW (ref 135–145)

## 2024-08-15 LAB — VANCOMYCIN, RANDOM: Vancomycin Rm: 54 ug/mL

## 2024-08-15 MED ORDER — ACETAMINOPHEN 650 MG RE SUPP: 650.0000 mg | Freq: Four times a day (QID) | RECTAL | Status: DC | PRN

## 2024-08-15 MED ORDER — MIDAZOLAM HCL 2 MG/2ML IJ SOLN: 1.0000 mg | INTRAMUSCULAR | Status: DC | PRN

## 2024-08-15 MED ORDER — GLYCOPYRROLATE 1 MG PO TABS: 1.0000 mg | ORAL_TABLET | ORAL | Status: DC | PRN

## 2024-08-15 MED ORDER — CALCIUM GLUCONATE-NACL 2-0.675 GM/100ML-% IV SOLN
2.0000 g | Freq: Once | INTRAVENOUS | Status: AC
  Administered 2024-08-15: 2000 mg via INTRAVENOUS
  Filled 2024-08-15: qty 100

## 2024-08-15 MED ORDER — SODIUM CHLORIDE 0.9 % IV SOLN: INTRAVENOUS | Status: DC

## 2024-08-15 MED ORDER — ACETAMINOPHEN 325 MG PO TABS: 650.0000 mg | ORAL_TABLET | Freq: Four times a day (QID) | ORAL | Status: DC | PRN

## 2024-08-15 MED ORDER — SODIUM CHLORIDE 0.9 % IV SOLN
100.0000 mg | INTRAVENOUS | Status: DC
  Administered 2024-08-15: 100 mg via INTRAVENOUS
  Filled 2024-08-15: qty 5

## 2024-08-15 MED ORDER — GLYCOPYRROLATE 0.2 MG/ML IJ SOLN
0.2000 mg | INTRAMUSCULAR | Status: DC | PRN
  Filled 2024-08-15: qty 1

## 2024-08-15 MED ORDER — FENTANYL 2500MCG IN NS 250ML (10MCG/ML) PREMIX INFUSION: 0.0000 ug/h | INTRAVENOUS | Status: DC

## 2024-08-15 MED ORDER — POLYVINYL ALCOHOL 1.4 % OP SOLN: 1.0000 [drp] | Freq: Four times a day (QID) | OPHTHALMIC | Status: DC | PRN

## 2024-08-15 MED ORDER — MIDAZOLAM-SODIUM CHLORIDE 100-0.9 MG/100ML-% IV SOLN
0.0000 mg/h | INTRAVENOUS | Status: DC
  Administered 2024-08-15: 1 mg/h via INTRAVENOUS
  Filled 2024-08-15: qty 100

## 2024-08-15 MED ORDER — FENTANYL BOLUS VIA INFUSION
100.0000 ug | INTRAVENOUS | Status: DC | PRN
  Administered 2024-08-15 (×2): 100 ug via INTRAVENOUS

## 2024-08-15 MED ORDER — MIDAZOLAM BOLUS VIA INFUSION (WITHDRAWAL LIFE SUSTAINING TX): 2.0000 mg | INTRAVENOUS | Status: DC | PRN

## 2024-08-15 MED ORDER — GLYCOPYRROLATE 0.2 MG/ML IJ SOLN: 0.2000 mg | INTRAMUSCULAR | Status: DC | PRN

## 2024-08-16 LAB — CULTURE, BLOOD (ROUTINE X 2)
Culture  Setup Time: NO GROWTH
Special Requests: ADEQUATE

## 2024-08-17 LAB — CULTURE, BLOOD (ROUTINE X 2): Culture  Setup Time: NO GROWTH — AB

## 2024-08-17 LAB — AEROBIC/ANAEROBIC CULTURE W GRAM STAIN (SURGICAL/DEEP WOUND): Gram Stain: NONE SEEN

## 2024-08-21 LAB — YEAST SUSCEPTIBILITIES
Amphotericin B MIC: 1
Fluconazole Islt MIC: 16
ISAVUCONAZOLE MIC: 0.12
Itraconazole MIC: 0.5
Posaconazole MIC: 1
Voriconazole MIC: 0.25

## 2024-08-21 LAB — CULTURE, BLOOD (ROUTINE X 2): Special Requests: ADEQUATE

## 2024-08-22 NOTE — Progress Notes (Signed)
 PHARMACY - PHYSICIAN COMMUNICATION CRITICAL VALUE ALERT - BLOOD CULTURE IDENTIFICATION (BCID)  Kelly MCGUIRT is an 73 y.o. female who presented to [facility] on 08/12/2024 with a chief complaint of deep space neck abscess  Name of physician (or Provider) Contacted: Dr. Haze  Current antibiotics: Vancomycin , Unasyn   Changes to prescribed antibiotics recommended:  Start Micafungin  100 mg IV q24h  Results for orders placed or performed during the hospital encounter of 08/12/24  Blood Culture ID Panel (Reflexed) (Collected: 08/12/2024 10:26 AM)  Result Value Ref Range   Enterococcus faecalis NOT DETECTED NOT DETECTED   Enterococcus Faecium NOT DETECTED NOT DETECTED   Listeria monocytogenes NOT DETECTED NOT DETECTED   Staphylococcus species NOT DETECTED NOT DETECTED   Staphylococcus aureus (BCID) NOT DETECTED NOT DETECTED   Staphylococcus epidermidis NOT DETECTED NOT DETECTED   Staphylococcus lugdunensis NOT DETECTED NOT DETECTED   Streptococcus species NOT DETECTED NOT DETECTED   Streptococcus agalactiae NOT DETECTED NOT DETECTED   Streptococcus pneumoniae NOT DETECTED NOT DETECTED   Streptococcus pyogenes NOT DETECTED NOT DETECTED   A.calcoaceticus-baumannii NOT DETECTED NOT DETECTED   Bacteroides fragilis NOT DETECTED NOT DETECTED   Enterobacterales NOT DETECTED NOT DETECTED   Enterobacter cloacae complex NOT DETECTED NOT DETECTED   Escherichia coli NOT DETECTED NOT DETECTED   Klebsiella aerogenes NOT DETECTED NOT DETECTED   Klebsiella oxytoca NOT DETECTED NOT DETECTED   Klebsiella pneumoniae NOT DETECTED NOT DETECTED   Proteus species NOT DETECTED NOT DETECTED   Salmonella species NOT DETECTED NOT DETECTED   Serratia marcescens NOT DETECTED NOT DETECTED   Haemophilus influenzae NOT DETECTED NOT DETECTED   Neisseria meningitidis NOT DETECTED NOT DETECTED   Pseudomonas aeruginosa NOT DETECTED NOT DETECTED   Stenotrophomonas maltophilia NOT DETECTED NOT DETECTED   Candida  albicans NOT DETECTED NOT DETECTED   Candida auris NOT DETECTED NOT DETECTED   Candida glabrata DETECTED (A) NOT DETECTED   Candida krusei NOT DETECTED NOT DETECTED   Candida parapsilosis NOT DETECTED NOT DETECTED   Candida tropicalis NOT DETECTED NOT DETECTED   Cryptococcus neoformans/gattii NOT DETECTED NOT DETECTED    Clair Agent 16-Aug-2024  5:15 AM

## 2024-08-22 NOTE — IPAL (Signed)
  Interdisciplinary Goals of Care Family Meeting   Date carried out: 09-13-2024  Location of the meeting: Bedside  Member's involved: Nurse Practitioner and Family Member or next of kin  Durable Power of Attorney or acting medical decision maker: husband     Discussion: We discussed goals of care for Kelly Chambers .  Met w husband x 2 daughters at bedside. Decision reached to transition to comfort care.  Will  keep propofol  running at current dose (20mcg/kg/min) as this has been needed for burst suppression  Titrate fent gtt for symptoms, and if needed a versed  infusion is also ordered for symptom management as well  Anticipate in-hospital death    Code status:   Code Status: Do not attempt resuscitation (DNR) - Comfort care   Disposition: In-patient comfort care  Time spent for the meeting: 10 min     Ronnald FORBES Gave, NP  09-13-2024, 12:16 PM

## 2024-08-22 NOTE — Progress Notes (Signed)
 Nutrition Brief Note  Chart reviewed. Pt triggered for nutrition assessment due to NPO status on vent.  Pt now transitioning to comfort care.  No nutrition interventions planned at this time.  Please re-consult as needed.   Betsey Finger MS, RDN, LDN, CNSC Registered Dietitian 3 Clinical Nutrition RD Inpatient Contact Info in Amion

## 2024-08-22 NOTE — Progress Notes (Signed)
   September 02, 2024 1339  Spiritual Encounters  Type of Visit Initial  Care provided to: Family  Reason for visit Routine spiritual support  OnCall Visit No  Spiritual Framework  Presenting Themes Impactful experiences and emotions  Community/Connection Family  Patient Stress Factors None identified  Family Stress Factors Major life changes  Interventions  Spiritual Care Interventions Made Compassionate presence;Established relationship of care and support  Intervention Outcomes  Outcomes Awareness of support;Awareness of health   Chaplain responded to page regarding Pt's transition into comfort care. Family requested  chaplain presence. Chaplain provided emotional and spiritual support, including a word of prayer at the family's request.  Family members reflected on their lives and the Pt's journey. Chaplain encouraged the family to take time to process and  discuss together as they prepared to make decisions with the medical team.  Family was reminded that chaplain services remain available for continued emotional and spiritual support.

## 2024-08-22 NOTE — Progress Notes (Signed)
Pt extubated to comfort care measures.

## 2024-08-22 NOTE — Progress Notes (Signed)
 NAME:  MIRAY MANCINO, MRN:  [id number], DOB:  July 05, 1951, LOS: 3 ADMISSION DATE:  08/12/2024, CONSULTATION DATE: 08/12/2024 REFERRING MD: Emergency department physician, CHIEF COMPLAINT: Status post PEA arrest  History of Present Illness:  73 year old female with a extensive past medical history O2 dependent but does not wear her oxygen  she had a witnessed arrest was found to be in PEA required approximately 7 minutes of CPR for return of spontaneous circulation's.  She is now intubated and is respiratory rates been increased to 30 due to PCO2 of 84 and a pH of 6.89.   Pulmonary critical care has been called to evaluate and admit to the intensive care unit.  Pertinent  Medical History   Past Medical History:  Diagnosis Date   Allergy    hay fever   Anemia    Anxiety    Blood transfusion without reported diagnosis    c section   Cataract    no surgery   Clotting disorder (HCC)    COPD (chronic obstructive pulmonary disease) (HCC)    Depression    Emphysema of lung (HCC)    GERD (gastroesophageal reflux disease)    Hyperlipidemia    Hypertension    Orthostatic hypotension    Osteoarthritis    back hips shoulders and hands   Ovarian cancer (HCC)    Renal failure, chronic, stage 3 (moderate) (HCC) 06/26/2017   Stroke (HCC)    Syncope and collapse    Thyroid  disease      Significant Hospital Events: Including procedures, antibiotic start and stop dates in addition to other pertinent events   8/22 admitted status post PEA cardiac arrest, noted to have large Ludewig angina, underwent incision and drainage of deep space neck abscess by ENT. MRSA in abscess cx  8/23 came off of vasopressor support, serum creatinine continue to rise, remain oligoanuric  8/24 CT H w loss of gray/white differentiation in L basal ganglia c/f anoxic injury. CT neck w residual abscess   09/02/2024 admission Bcx now revealing fungemia  Interim History / Subjective:  Husband was told about CT H findings c/f  anoxic injury last night  Made DNR   Fungemic, micafungin  started this morning   Objective    Blood pressure 131/63, pulse 86, temperature (!) 97.3 F (36.3 C), resp. rate (!) 32, height 4' 9 (1.448 m), weight 58.8 kg, SpO2 94%. CVP:  [7 mmHg-53 mmHg] 11 mmHg  Vent Mode: PRVC FiO2 (%):  [60 %-80 %] 60 % Set Rate:  [32 bmp] 32 bmp Vt Set:  [420 mL] 420 mL PEEP:  [10 cmH20] 10 cmH20 Plateau Pressure:  [25 cmH20-27 cmH20] 25 cmH20   Intake/Output Summary (Last 24 hours) at September 02, 2024 1029 Last data filed at 09/02/24 0700 Gross per 24 hour  Intake 2303.13 ml  Output 495 ml  Net 1808.13 ml   Filed Weights   08/13/24 0310 08/14/24 0436 2024/09/02 0538  Weight: 42.1 kg 57.6 kg 58.8 kg    Examination:  General: critically and chronically ill older adult F intubated sedated  HEENT: NCAT. ETT secure. Neck is dressed  Neuro: Pupils are sluggish. Does not follow commands. Initiates respirations  Pulm: Mechanically ventialted. Symmetrical chest expansion. Coarse sounds   Heart: rrr  Abdomen: soft ndnt  GU: foley    Resolved problem list  Septic shock  Assessment and Plan   OOH PEA arrest Likely Anoxic brain injury Myoclonus,improved  GOC DNR  -CT H  with findings c/f anoxic injury + early myoclonus post arrest --  all c/f anoxic brain injury - looks like he was made aware overnight and code status changed to DNR  P -need to discuss GOC further 08/18/2024, left VM for him. Based on documented discussion sounds like he may elect for comfort care transition   -if ongoing care / add'l prognostic data desired, would need MRI brain   Septic shock 2/2  Ludwigs angina / neck abscess w MRSA, multifocal PNA & fungemia (candida glabrata) -residual v recurrent abscess following I&D  P -with poor prognosis, most prudent to solidify GOC plans with likely transition to comfort care. 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 Critical care was necessary to treat or prevent imminent or life-threatening deterioration.  Critical care was time spent personally by me on the following activities: development of treatment plan with patient and/or surrogate as well as nursing, discussions with consultants, evaluation of patient's response to treatment, examination of patient, obtaining history from patient or surrogate, ordering and performing treatments and interventions, ordering and review of laboratory studies, ordering and review of radiographic studies, pulse oximetry and re-evaluation of patient's condition.  Ronnald Gave MSN, AGACNP-BC [facility] Amion for pager  2024/08/18, 10:29 AM

## 2024-08-22 NOTE — Progress Notes (Signed)
 Pharmacy Antibiotic Note  Kelly Chambers is a 73 y.o. female admitted on 08/12/2024 with sepsis.  Pharmacy has been consulted for Vancomycin  dosing.   Vancomycin  random this am is elevated at 54 mcg/ml. Cr continues to rise. Blood cultures with C glabrata.  Plan: Continue Unasyn  to 3g IV q24h  Micafungin  100mg  IV q24h Hold vancomycin  - will check VR in 48h  Height: 4' 9 (144.8 cm) Weight: 58.8 kg (129 lb 10.1 oz) IBW/kg (Calculated) : 38.6  Temp (24hrs), Avg:98.3 F (36.8 C), Min:97.2 F (36.2 C), Max:99.3 F (37.4 C)  Recent Labs  Lab 08/12/24 1012 08/12/24 1014 08/12/24 1030 08/12/24 1210 08/12/24 1212 08/12/24 1514 08/13/24 0049 08/13/24 0348 08/13/24 1021 08/13/24 1241 08/14/24 0514 08/14/24 1021 09-09-24 0515  WBC 47.7*  --   --   --   --   --   --  27.9*  --   --  47.0*  --  67.9*  CREATININE  --    < > 3.10*  --  3.25*  --   --  3.51*  --   --  4.45*  --  5.18*  LATICACIDVEN  --    < >  --    < >  --    < > 3.4* 3.3* 2.2* 2.6*  --  1.2  --   VANCORANDOM  --   --   --   --   --   --   --   --   --   --  19  --  54*   < > = values in this interval not displayed.    Estimated Creatinine Clearance: 7.3 mL/min (A) (by C-G formula based on SCr of 5.18 mg/dL (H)).    Allergies  Allergen Reactions   Trazodone  Anaphylaxis    Syncope   Penicillins Itching    Skin itching    Antimicrobials this admission: 8/22 Unasyn  >>  8/22 Vanc >>  8/22 Cefepime  x 1 8/22 Azithromycin  x 1 09-09-2024 Micafungin  >>  Microbiology results: 8/22 BCx: ngtd 8/22 mouth culture: MRSA 8/22 MRSA PCR: positive  Thank you for allowing pharmacy to be a part of this patient's care.  Morna Breach, PharmD PGY2 Cardiology Pharmacy Resident 09/09/24 10:53 AM

## 2024-08-22 NOTE — Death Summary Note (Signed)
 DEATH SUMMARY   Patient Details  Name: Kelly Chambers MRN: [id number] DOB: 1951/07/09  Admission/Discharge Information   Admit Date:  2024/08/30  Date of Death:  09-02-2024  Time of Death:  1600  Length of Stay: 3  Referring Physician: Drew Domino, NP   Reason(s) for Hospitalization  Cardiac arrest   Diagnoses  Preliminary cause of death: anoxic brain injury  Secondary Diagnoses (including complications and co-morbidities):  Principal Problem:   Cardiac arrest (HCC) Myoclonus Ludwig's Angina Sublingual sialodenitis MRSA head/neck abscess Fungemia  Acute on chronic respiratory failure with hypoxia and hypercarbia  Hx COPD  Multifocal PNA RUL lung mass Septic shock AKI on CKD 3b Elevated LFTs  Hyponatremia Hypocalcemia  Anemia  DNR Goals of care  Encounter for palliative care   Brief Hospital Course (including significant findings, care, treatment, and services provided and events leading to death)  Kelly Chambers is a 73 y.o. year old female who was admitted to [facility] 2024-08-30 following OOH PEA arrest. Days prior she was seen at primary care for sore throat and felt to have sublingual sialodenitis. She was significant acidotic in the ED w pH 6.89. During ED workup, CT head neck/soft tissue was significant for Ludwig's angina & imaging otherwise with multifocal PNA. The patient was started on unasyn . The patient had myoclonus soon after ROSC. ENT was consulted Aug 30, 2024 and patient went for emergency I&D of deep space neck abscess / Ludwig's angina. Cultures would later reveal MRSA and she was started 8/24. Admission blood cultures resulted 09-02-24 with candida; and she was started on micafungin  for fungemia. The patient's course was also complicated by AKI, shock liver, myoclonus, and evidence of anoxic brain injury on CT H 8/24. In learning of her neurologic insult, family changed code status to DNR on 08/14/24. On 02-Sep-2024 Family elected to transition to comfort care. She was  compassionately extubated and later died peacefully. TOD 16:00 09-02-2024    Pertinent Labs and Studies  Significant Diagnostic Studies CT SOFT TISSUE NECK W CONTRAST Result Date: 08/14/2024 CLINICAL DATA:  Provided history: Soft tissue infection suspected, neck, x-ray done. EXAM: CT NECK WITH CONTRAST TECHNIQUE: Multidetector CT imaging of the neck was performed using the standard protocol following the bolus administration of intravenous contrast. RADIATION DOSE REDUCTION: This exam was performed according to the departmental dose-optimization program which includes automated exposure control, adjustment of the mA and/or kV according to patient size and/or use of iterative reconstruction technique. CONTRAST:  OMNIPAQUE  IOHEXOL  350 MG/ML SOLN COMPARISON:  Neck CT 08/30/24.  Chest CT Aug 31, 2011 0.5. FINDINGS: Pharynx and larynx: Intubated. Oroenteric tube as described below. Sublingual, right parapharyngeal and right masticator space findings as described below. Salivary glands: Interval incision and drainage of the patient has bilateral deep space abscess. There are multiple surgical drains which now terminate in the sublingual space. Small-volume fluid remains within the component of the abscess within the mid and anterior sublingual space (right greater than left). A more substantial residual component of the abscess is present at the posterior aspect of the right sublingual space, again tracking into the right masticator and parapharyngeal spaces and along the body and angle of the mandible. Due to its irregular shape, this component of the abscess is difficult to accurately measure, but this component spans up to 7 cm in greatest dimension (for instance as seen on series 10, image 60). As before, a component of the abscess also extends into the retropharyngeal space (eccentric to the right), measuring 2.1 x 0.6 cm in transaxial  dimensions (series 6, image 47). The right parotid gland remains  heterogeneous and irregular and the abscess may extend to involve the right parotid space. The abscess also appears to extend into the right submandibular gland (for instance as seen on series 6, image 63) (series 10, image 61). Surrounding right facial and perimandibular inflammatory stranding consistent with cellulitis. Edema also extends more inferiorly to the level of the mid neck on the right. The left parotid and submandibular glands are unremarkable. Thyroid : The gland is diminutive, but otherwise unremarkable. Lymph nodes: No pathologically enlarged lymph nodes are identified. Vascular: The major vascular structures of the neck are patent. Atherosclerotic plaque within the aortic arch, proximal major branch vessels of the neck and carotid arteries. Limited intracranial: Intracranial findings separately reported on same day head CT. No pathologic intracranial enhancement at the imaged levels. Visualized orbits: No orbital mass or acute orbital finding. Mastoids and visualized paranasal sinuses: Minimal mucosal thickening within the right maxillary sinus. Right middle ear/mastoid effusion. Skeleton: Cervical spondylosis. Mild C7 superior endplate compression deformity, unchanged. The patient is edentulous. Prior right retrosigmoid craniotomy. Upper chest: Patchy pulmonary opacities within the bilateral upper lobes at the imaged levels, similar to the prior neck CT of 08/12/2024 and likely infectious/inflammatory. The ET tube terminates within the thoracic trachea just above the level the carina. Partially visualized imaged oroenteric tube. IMPRESSION: 1. Interval incision and drainage of the previously demonstrated extensive bilateral deep space abscess. Multiple surgical drains now present within the sublingual space. Persistent extensive multi-spatial and multiloculated abscess components as detailed within the body of the report (with most notable involvement of the posterior sublingual space on the right,  right masticator and parapharyngeal spaces, retropharyngeal space, perimandibular region and and right submandibular space). A residual abscess component appears to involve the right submandibular gland. Additionally, the right parotid gland remains markedly heterogeneous and irregular suggesting parotitis (and the abscess may extend to involve the right parotid space). 2. Persistent patchy pulmonary opacities within the bilateral upper lobes at the imaged levels, likely infectious/inflammatory. Electronically Signed   By: Rockey Childs D.O.   On: 08/14/2024 16:43   CT CHEST WO CONTRAST Result Date: 08/14/2024 CLINICAL DATA:  Normal chest radiograph.  Pulmonary nodule. EXAM: CT CHEST WITHOUT CONTRAST TECHNIQUE: Multidetector CT imaging of the chest was performed following the standard protocol without IV contrast. RADIATION DOSE REDUCTION: This exam was performed according to the departmental dose-optimization program which includes automated exposure control, adjustment of the mA and/or kV according to patient size and/or use of iterative reconstruction technique. COMPARISON:  Chest radiograph dated 08/13/2024 and CT dated 08/12/2024. FINDINGS: Evaluation of this exam is limited in the absence of intravenous contrast. Cardiovascular: There is no cardiomegaly or pericardial effusion. There is coronary vascular calcification. Mild atherosclerotic calcification of the thoracic aorta. No evidence of bowel dilatation. The central pulmonary arteries are grossly unremarkable. Mediastinum/Nodes: Evaluation of the hilar lymph nodes is limited in the absence of intravenous contrast as well as due to opacification of the adjacent lungs. An enteric tube noted in the esophagus. No mediastinal fluid collection. Left subclavian venous catheter with tip over central SVC. Left-sided PICC with tip in the left subclavian vasculature. Lungs/Pleura: There is background of chronic antral coarsening. Bilateral patchy consolidative  changes again noted. Slight progression of bibasilar subpleural opacities. No pleural effusion pneumothorax. The central airways are patent. Endotracheal tube approximately 2 cm above the carina. Upper Abdomen: Partially visualized small ascites.  Cholecystectomy. Musculoskeletal: No acute osseous pathology. IMPRESSION: 1. Bilateral patchy consolidative  changes with slight progression of bibasilar subpleural opacities. 2. Partially visualized small ascites. 3.  Aortic Atherosclerosis (ICD10-I70.0). Electronically Signed   By: Vanetta Chou M.D.   On: 08/14/2024 15:34   CT HEAD WO CONTRAST ( ) Result Date: 08/14/2024 CLINICAL DATA:  Provided history: Neuro deficit, acute, stroke suspected. EXAM: CT HEAD WITHOUT CONTRAST TECHNIQUE: Contiguous axial images were obtained from the base of the skull through the vertex without intravenous contrast. RADIATION DOSE REDUCTION: This exam was performed according to the departmental dose-optimization program which includes automated exposure control, adjustment of the mA and/or kV according to patient size and/or use of iterative reconstruction technique. COMPARISON:  Head CT 08/12/2024. FINDINGS: Brain: No age-advanced or lobar predominant cerebral atrophy. Subtle loss of gray-white differentiation is questioned within the left basal ganglia, and this could potentially reflect sequela of acute hypoxic/ischemic injury or an acute infarct. Patchy and ill-defined hypoattenuation within the cerebral white matter, nonspecific but compatible with moderate chronic small vessel ischemic disease. There is no acute intracranial hemorrhage. No extra-axial fluid collection. No evidence of an intracranial mass. No midline shift. Vascular: Atherosclerotic calcifications. Skull: Prior right retrosigmoid craniotomy. No acute calvarial fracture or aggressive osseous lesion. Sinuses/Orbits: No mass or acute finding within the imaged orbits. Small-volume secretions within the right  sphenoid sinus. Partially visualized oroenteric tube within the pharynx. Impression #1 will be called to the ordering clinician or representative by the Radiologist Assistant, and communication documented in the PACS or [employer]. IMPRESSION: 1. Subtle loss of gray-white differentiation is questioned within the left basal ganglia, and this could potentially reflect sequela of acute hypoxic/ischemic injury or an acute infarct. Consider a brain MRI for further evaluation. 2. Background parenchymal atrophy and chronic small vessel ischemic disease. 3. Minor right sphenoid sinus disease. Electronically Signed   By: Rockey Childs D.O.   On: 08/14/2024 15:06   Overnight EEG with video Result Date: 08/14/2024 Shelton Arlin KIDD, MD     08/14/2024  1:34 PM Patient Name: Kelly Chambers MRN: [id number] Epilepsy Attending: Arlin KIDD Shelton Referring Physician/Provider: Harold Scholz, MD Duration: 08/13/2024 1140 to 08/14/2024 1309 Patient history: 73yo F s/p cardiac arrest. EEG to evaluate for seizure Level of alertness: comatose AEDs during EEG study: Propofol  Technical aspects: This EEG study was done with scalp electrodes positioned according to the 10-20 International system of electrode placement. Electrical activity was reviewed with band pass filter of 1-70Hz , sensitivity of 7 uV/mm, display speed of 49mm/sec with a 60Hz  notched filter applied as appropriate. EEG data were recorded continuously and digitally stored.  Video monitoring was available and reviewed as appropriate. Description: EEG showed burst suppression pattern with high amplitude bursts of 3 to 6 Hz theta-delta slowing lasting 2-7 seconds alternating with 3-11 seconds of generalized suppression. Gradually the eeg evolved and showed generalized attenuation. Hyperventilation and photic stimulation were not performed.   ABNORMALITY - Burst suppression, generalized IMPRESSION: This study is suggestive of profound diffuse encephalopathy. No seizures or  epileptiform discharges were seen throughout the recording. Arlin KIDD Shelton   DG Chest Port 1 View Result Date: 08/13/2024 CLINICAL DATA:  5626 Acute respiratory failure (HCC) 5626 EXAM: PORTABLE CHEST 1 VIEW COMPARISON:  Chest x-ray 08/12/2024. FINDINGS: Endotracheal tube with tip terminating 4 cm above the carina. Left subclavian central venous catheter with tip overlying the superior cavoatrial junction. Enteric tube courses below the diaphragm with tip common beaded off view and side port overlying the expected region of the gastric lumen. The heart and mediastinal contours are within normal  limits. Persistent diffuse patchy airspace and interstitial opacities. Persistent bilateral trace pleural effusions. No pneumothorax. No acute osseous abnormality. IMPRESSION: Persistent diffuse patchy airspace and interstitial opacities. Persistent bilateral trace pleural effusions. Electronically Signed   By: Morgane  Naveau M.D.   On: 08/13/2024 00:38   DG CHEST PORT 1 VIEW Result Date: 08/12/2024 CLINICAL DATA:  Central line placement EXAM: PORTABLE CHEST 1 VIEW COMPARISON:  08/12/2024 FINDINGS: Endotracheal tube in place with tip measuring 3.9 cm above the carina. Enteric tube is present. Tip is off the field of view but below the left hemidiaphragm. Left central venous catheter with tip over the low SVC region. No pneumothorax. Low lung volumes. Normal heart size. Coarse airspace and interstitial infiltrates throughout both lungs are similar to prior study, possibly fibrosis, edema, or pneumonia. Mediastinal contours appear intact. IMPRESSION: 1. Appliances appear in satisfactory position. 2. Low lung volumes with coarse bilateral pulmonary infiltrates, similar to prior study. Electronically Signed   By: Elsie Gravely M.D.   On: 08/12/2024 15:26   ECHOCARDIOGRAM COMPLETE Result Date: 08/12/2024    ECHOCARDIOGRAM REPORT   Patient Name:   Kelly Chambers Date of Exam: 08/12/2024 Medical Rec #:  [id number]       Height:       57.0 in Accession #:    [id number]     Weight:       92.6 lb Date of Birth:  08-28-1951       BSA:          1.297 m Patient Age:    73 years       BP:           106/65 mmHg Patient Gender: F              HR:           83 bpm. Exam Location:  Inpatient Procedure: 2D Echo (Both Spectral and Color Flow Doppler were utilized during            procedure). STAT ECHO Indications:    pericardial effusion  History:        Patient has prior history of Echocardiogram examinations, most                 recent 11/03/2017. CAD, COPD; Risk Factors:Hypertension,                 Dyslipidemia and Former Smoker.  Sonographer:    Tinnie Barefoot RDCS Referring Phys: [id number] DANIELLE RAY IMPRESSIONS  1. Left ventricular ejection fraction, by estimation, is 50 to 55%. The left ventricle has low normal function. The left ventricle has no regional wall motion abnormalities. Left ventricular diastolic parameters are indeterminate.  2. Right ventricular systolic function is normal. The right ventricular size is moderately enlarged. There is mildly elevated pulmonary artery systolic pressure.  3. The mitral valve is normal in structure. Trivial mitral valve regurgitation. No evidence of mitral stenosis.  4. The aortic valve is tricuspid. There is mild calcification of the aortic valve. Aortic valve regurgitation is not visualized. Aortic valve sclerosis is present, with no evidence of aortic valve stenosis.  5. The inferior vena cava is normal in size with <50% respiratory variability, suggesting right atrial pressure of 8 mmHg. Comparison(s): Changes from prior study are noted. EF now low normal. Conclusion(s)/Recommendation(s): Otherwise normal echocardiogram, with minor abnormalities described in the report. FINDINGS  Left Ventricle: Left ventricular ejection fraction, by estimation, is 50 to 55%. The left ventricle has low normal function. The left ventricle  has no regional wall motion abnormalities. The left ventricular  internal cavity size was normal in size. There is no left ventricular hypertrophy. Left ventricular diastolic parameters are indeterminate. Right Ventricle: The right ventricular size is moderately enlarged. Right vetricular wall thickness was not well visualized. Right ventricular systolic function is normal. There is mildly elevated pulmonary artery systolic pressure. The tricuspid regurgitant velocity is 2.77 m/s, and with an assumed right atrial pressure of 8 mmHg, the estimated right ventricular systolic pressure is 38.7 mmHg. Left Atrium: Left atrial size was normal in size. Right Atrium: Right atrial size was normal in size. Pericardium: There is no evidence of pericardial effusion. Mitral Valve: The mitral valve is normal in structure. Trivial mitral valve regurgitation. No evidence of mitral valve stenosis. Tricuspid Valve: The tricuspid valve is normal in structure. Tricuspid valve regurgitation is mild . No evidence of tricuspid stenosis. Aortic Valve: The aortic valve is tricuspid. There is mild calcification of the aortic valve. Aortic valve regurgitation is not visualized. Aortic valve sclerosis is present, with no evidence of aortic valve stenosis. Pulmonic Valve: The pulmonic valve was not well visualized. Pulmonic valve regurgitation is mild. No evidence of pulmonic stenosis. Aorta: The aortic root and ascending aorta are structurally normal, with no evidence of dilitation. Venous: The inferior vena cava is normal in size with less than 50% respiratory variability, suggesting right atrial pressure of 8 mmHg. IAS/Shunts: The atrial septum is grossly normal.  LEFT VENTRICLE PLAX 2D LVIDd:         3.60 cm   Diastology LVIDs:         2.70 cm   LV e' medial:    6.53 cm/s LV PW:         0.90 cm   LV E/e' medial:  8.7 LV IVS:        1.00 cm   LV e' lateral:   8.05 cm/s LVOT diam:     1.80 cm   LV E/e' lateral: 7.1 LV SV:         38 LV SV Index:   29 LVOT Area:     2.54 cm  RIGHT VENTRICLE             IVC  RV Basal diam:  2.90 cm     IVC diam: 1.70 cm RV S prime:     15.40 cm/s TAPSE (M-mode): 1.8 cm LEFT ATRIUM           Index        RIGHT ATRIUM           Index LA diam:      2.70 cm 2.08 cm/m   RA Area:     11.70 cm LA Vol (A2C): 25.9 ml 19.98 ml/m  RA Volume:   28.00 ml  21.60 ml/m LA Vol (A4C): 28.5 ml 21.98 ml/m  AORTIC VALVE LVOT Vmax:   106.00 cm/s LVOT Vmean:  54.500 cm/s LVOT VTI:    0.149 m  AORTA Ao Root diam: 3.30 cm Ao Asc diam:  3.10 cm MITRAL VALVE               TRICUSPID VALVE MV Area (PHT): 3.77 cm    TR Peak grad:   30.7 mmHg MV Decel Time: 201 msec    TR Vmax:        277.00 cm/s MV E velocity: 57.10 cm/s MV A velocity: 94.90 cm/s  SHUNTS MV E/A ratio:  0.60        Systemic VTI:  0.15 m                            Systemic Diam: 1.80 cm Shelda Bruckner MD Electronically signed by Shelda Bruckner MD Signature Date/Time: 08/12/2024/2:35:50 PM    Final    CT Angio Chest PE W and/or Wo Contrast Result Date: 08/12/2024 CLINICAL DATA:  Cardiac arrest.  Status post CPR. EXAM: CT ANGIOGRAPHY CHEST WITH CONTRAST TECHNIQUE: Multidetector CT imaging of the chest was performed using the standard protocol during bolus administration of intravenous contrast. Multiplanar CT image reconstructions and MIPs were obtained to evaluate the vascular anatomy. RADIATION DOSE REDUCTION: This exam was performed according to the departmental dose-optimization program which includes automated exposure control, adjustment of the mA and/or kV according to patient size and/or use of iterative reconstruction technique. CONTRAST:  75mL OMNIPAQUE  IOHEXOL  350 MG/ML SOLN COMPARISON:  None available currently. FINDINGS: Cardiovascular: Satisfactory opacification of the pulmonary arteries to the segmental level. No evidence of pulmonary embolism. Normal heart size. No pericardial effusion. Aortic atherosclerosis. Mediastinum/Nodes: Endotracheal tube is in grossly good position. Thyroid  gland is unremarkable.  Nasogastric tube is seen passing through esophagus into stomach. No definite adenopathy is noted. Lungs/Pleura: No pneumothorax or pleural effusion is noted. Patchy opacities are noted throughout both lungs, but most prominently seen in the upper lobes, most consistent with multifocal pneumonia or less likely edema. Possible emphysematous disease is noted as well. Upper Abdomen: No acute abnormality. Musculoskeletal: No chest wall abnormality. No acute or significant osseous findings. Review of the MIP images confirms the above findings. IMPRESSION: 1. No definite evidence of pulmonary embolus. 2. Patchy opacities are noted throughout both lungs, but most prominently seen in the upper lobes, most consistent with multifocal pneumonia or less likely edema. 3. Endotracheal and nasogastric tubes are in grossly good position. Aortic Atherosclerosis (ICD10-I70.0) and Emphysema (ICD10-J43.9). Electronically Signed   By: Lynwood Landy Raddle M.D.   On: 08/12/2024 14:11   CT Soft Tissue Neck W Contrast Addendum Date: 08/12/2024 ADDENDUM REPORT: 08/12/2024 13:10 ADDENDUM: Critical Value/emergent results were called by telephone at the time of interpretation on 08/12/2024 at 1304 hours to ICU Dr. Theophilus, who verbally acknowledged these results. Electronically Signed   By: VEAR Hurst M.D.   On: 08/12/2024 13:10   Result Date: 08/12/2024 CLINICAL DATA:  73 year old female status post CPR, witnessed arrest. Recent neck swelling of unknown etiology. EXAM: CT NECK WITH CONTRAST TECHNIQUE: Multidetector CT imaging of the neck was performed using the standard protocol following the bolus administration of intravenous contrast. RADIATION DOSE REDUCTION: This exam was performed according to the departmental dose-optimization program which includes automated exposure control, adjustment of the mA and/or kV according to patient size and/or use of iterative reconstruction technique. CONTRAST:  75mL OMNIPAQUE  IOHEXOL  350 MG/ML SOLN COMPARISON:   Head CT, CTA chest reported separately today. FINDINGS: Pharynx and larynx: Intubated. Oral enteric tube in place which loops mildly in the nasopharynx. Expected course of both tubes Confluent abnormal soft tissue edema and/or fluid tracking into the right parapharyngeal and retropharyngeal spaces, epicenter appears to be the sublingual space as stated below. Left parapharyngeal space is spared. Salivary glands: Severe abnormality of the sublingual space appears to be multifocal serpiginous and rim enhancing abscesses bilaterally (series 3, image 51 and coronal image 71). Individual rim enhancing fluid collections in the sublingual space are up to 35 by 34 x 15 mm (AP by transverse by CC), indicating up to 8 mL each. And note  extensive tracking of the right side sublingual abscess through the right masticator space cephalad, medial and posterior to the right mandible (series 3, image 33 and sagittal image 37). That heterogeneous abscess site encompasses 60 x 18 x 25 mm (AP by transverse by CC) for an estimated volume of 14 mL. And serpiginous extension of that lesion into the right parapharyngeal and retropharyngeal spaces (series 3 images 21 and 31 respectively). Furthermore, the right parotid space appears asymmetrically enlarged and heterogeneous (compare bilaterally on coronal image 52) which could be primary or secondary infection. The right submandibular gland is also highly heterogeneous and irregular. The left submandibular space and left masticator spaces are relatively spared. Left parotid gland appears negative. Thyroid : Diminutive, negative. Lymph nodes: Negative, no significant cervical lymphadenopathy. Vascular: Major vascular structures in the bilateral neck and at the skull base are patent and enhancing. Increased reactive appearing retropharyngeal venous enhancement such as on coronal image 45. Limited intracranial: No abnormal intracranial enhancement identified, head CT reported separately.  Visualized orbits: Negative. Mastoids and visualized paranasal sinuses: Well aerated bilaterally. Skeleton: Absent dentition. Mild C7 anterior superior endplate compression, appears to be chronic. Advanced C5-C6 chronic disc and endplate degeneration. No acute or suspicious cervical vertebral findings. No acute or suspicious osseous abnormality. Upper chest: Multifocal confluent abnormal peribronchial and peripheral upper lung opacity appears Infectious or inflammatory. Endotracheal tube tip is partially visible just above the carina. Enteric tube courses in the visible esophagus. No superior mediastinal lymphadenopathy. See Chest CTA reported separately. IMPRESSION: 1. 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 Multiple discrete estimated 8-14 mL rim enhancing abscesses throughout those areas. Involvement of the right submandibular and right parotid glands. Absent dentition, original origin of infection uncertain. Recommend urgent ENT consultation. 2. Intubated with satisfactory visible ET tube and enteric tube. 3. Abnormal upper lung opacity is extensive, appears infectious or inflammatory. No definite superior mediastinal inflammation. See CTA Chest reported separately. Electronically Signed: By: VEAR Hurst M.D. On: 08/12/2024 13:03   CT HEAD WO CONTRAST Result Date: 08/12/2024 CLINICAL DATA:  Provided history: Mental status change, unknown cause. Additional history provided: Witnessed arrest status post CPR. EXAM: CT HEAD WITHOUT CONTRAST TECHNIQUE: Contiguous axial images were obtained from the base of the skull through the vertex without intravenous contrast. RADIATION DOSE REDUCTION: This exam was performed according to the departmental dose-optimization program which includes automated exposure control, adjustment  of the mA and/or kV according to patient size and/or use of iterative reconstruction technique. COMPARISON:  Maxillofacial CT 07/08/2019. Report from head CT 09/25/2018 (images unavailable). FINDINGS: Brain: No age-advanced or lobar predominant cerebral atrophy. Mild-to-moderate patchy and ill-defined hypoattenuation within the cerebral white matter, nonspecific but compatible chronic small vessel ischemic disease. There is no acute intracranial hemorrhage. No demarcated cortical infarct. Gray-white differentiation is preserved. No extra-axial fluid collection. No evidence of an intracranial mass. No midline shift. Vascular: No hyperdense vessel.  Atherosclerotic calcifications. Skull: No acute calvarial fracture or aggressive osseous lesion. Prior right retrosigmoid craniotomy. Per the electronic medical record, the patient has a history of trigeminal nerve decompression. Sinuses/Orbits: No mass or acute finding within the imaged orbits. No significant paranasal sinus disease at the imaged levels. IMPRESSION: 1. No evidence of an acute intracranial abnormality. Please note, a brain MRI would have greater sensitivity for acute hypoxic/ischemic injury. 2. Parenchymal atrophy and chronic small vessel ischemic disease. Electronically  Signed   By: Rockey Childs D.O.   On: 08/12/2024 12:52   DG Abd Portable 1V Result Date: 08/12/2024 CLINICAL DATA:  [id number] Do not intubate, perform CPR, or defibrillate [phone]. EXAM: PORTABLE CHEST 1 VIEW COMPARISON:  None Available. FINDINGS: There is increased amount of gas throughout the small bowel and colon, likely manifestation of adynamic ileus. Consider radiographic follow up if clinically indicated. No evidence of pneumoperitoneum. There are heterogeneous moderate-to-severe alveolar and interstitial opacities throughout bilateral lungs. Findings are nonspecific and differential diagnosis includes multilobar pneumonia, ARDS or pulmonary edema. Correlate clinically. No  pneumothorax. No significant pleural effusion seen. Normal cardio-mediastinal silhouette. No acute osseous abnormalities. The soft tissues are within normal limits. Surgical changes, devices, tubes and lines: Endotracheal tube is seen with its tip in the right proximal mainstem bronchus. Recommend withdrawing the endotracheal tube by 2-2.5 cm. There are surgical clips in the right upper quadrant, typical of a previous cholecystectomy. Enteric tube is seen with its tip just below the level of carina. Repositioning is recommended. IMPRESSION: 1. Endotracheal tube is seen with its tip in the right proximal mainstem bronchus. Repositioning is recommended. 2. Enteric tube is seen with its tip just below the level of carina. Repositioning is recommended. 3. There are heterogeneous moderate-to-severe alveolar and interstitial opacities throughout bilateral lungs. Findings are nonspecific and differential diagnosis includes multilobar pneumonia, ARDS or pulmonary edema. 4. There is increased amount of gas throughout the small bowel and colon, likely manifestation of adynamic ileus. Consider radiographic follow up if clinically indicated. These results will be called to the ordering clinician or representative by the Radiologist Assistant, and communication documented in the PACS or [employer]. Electronically Signed   By: Ree Molt M.D.   On: 08/12/2024 10:44   DG Chest Port 1 View Result Date: 08/12/2024 CLINICAL DATA:  [id number] Do not intubate, perform CPR, or defibrillate [phone]. EXAM: PORTABLE CHEST 1 VIEW COMPARISON:  None Available. FINDINGS: There is increased amount of gas throughout the small bowel and colon, likely manifestation of adynamic ileus. Consider radiographic follow up if clinically indicated. No evidence of pneumoperitoneum. There are heterogeneous moderate-to-severe alveolar and interstitial opacities throughout bilateral lungs. Findings are nonspecific and differential diagnosis includes  multilobar pneumonia, ARDS or pulmonary edema. Correlate clinically. No pneumothorax. No significant pleural effusion seen. Normal cardio-mediastinal silhouette. No acute osseous abnormalities. The soft tissues are within normal limits. Surgical changes, devices, tubes and lines: Endotracheal tube is seen with its tip in the right proximal mainstem bronchus. Recommend withdrawing the endotracheal tube by 2-2.5 cm. There are surgical clips in the right upper quadrant, typical of a previous cholecystectomy. Enteric tube is seen with its tip just below the level of carina. Repositioning is recommended. IMPRESSION: 1. Endotracheal tube is seen with its tip in the right proximal mainstem bronchus. Repositioning is recommended. 2. Enteric tube is seen with its tip just below the level of carina. Repositioning is recommended. 3. There are heterogeneous moderate-to-severe alveolar and interstitial opacities throughout bilateral lungs. Findings are nonspecific and differential diagnosis includes multilobar pneumonia, ARDS or pulmonary edema. 4. There is increased amount of gas throughout the small bowel and colon, likely manifestation of adynamic ileus. Consider radiographic follow up if clinically indicated. These results will be called to the ordering clinician or representative by the Radiologist Assistant, and communication documented in the PACS or [employer]. Electronically Signed   By: Ree Molt M.D.   On: 08/12/2024 10:44    Microbiology Recent Results (from the past 240 hours)  Culture, blood (Routine X 2) w Reflex to ID Panel     Status: None (Preliminary result)   Collection Time: 08/12/24 10:21 AM   Specimen: BLOOD LEFT HAND  Result Value Ref Range Status   Specimen Description BLOOD LEFT HAND  Final   Special Requests   Final    BOTTLES DRAWN AEROBIC AND ANAEROBIC Blood Culture adequate volume   Culture  Setup Time   Final    BUDDING YEAST SEEN AEROBIC BOTTLE ONLY CRITICAL VALUE NOTED.   VALUE IS CONSISTENT WITH PREVIOUSLY REPORTED AND CALLED VALUE. Performed at [facility], [address], [place], [place] [address]    Culture YEAST  Final   Report Status PENDING  Incomplete  Culture, blood (Routine X 2) w Reflex to ID Panel     Status: None (Preliminary result)   Collection Time: 08/12/24 10:26 AM   Specimen: BLOOD LEFT WRIST  Result Value Ref Range Status   Specimen Description BLOOD LEFT WRIST  Final   Special Requests   Final    BOTTLES DRAWN AEROBIC AND ANAEROBIC Blood Culture adequate volume   Culture  Setup Time   Final    BUDDING YEAST SEEN AEROBIC BOTTLE ONLY CRITICAL RESULT CALLED TO, READ BACK BY AND VERIFIED WITH: PHARMD J LEDFORD 08-22-2024 @ 0503 BY AB Performed at [facility], [address], [place], [place] [address]    Culture YEAST  Final   Report Status PENDING  Incomplete  Blood Culture ID Panel (Reflexed)     Status: Abnormal   Collection Time: 08/12/24 10:26 AM  Result Value Ref Range Status   Enterococcus faecalis NOT DETECTED NOT DETECTED Final   Enterococcus Faecium NOT DETECTED NOT DETECTED Final   Listeria monocytogenes NOT DETECTED NOT DETECTED Final   Staphylococcus species NOT DETECTED NOT DETECTED Final   Staphylococcus aureus (BCID) NOT DETECTED NOT DETECTED Final   Staphylococcus epidermidis NOT DETECTED NOT DETECTED Final   Staphylococcus lugdunensis NOT DETECTED NOT DETECTED Final   Streptococcus species NOT DETECTED NOT DETECTED Final   Streptococcus agalactiae NOT DETECTED NOT DETECTED Final   Streptococcus pneumoniae NOT DETECTED NOT DETECTED Final   Streptococcus pyogenes NOT DETECTED NOT DETECTED Final   A.calcoaceticus-baumannii NOT DETECTED NOT DETECTED Final   Bacteroides fragilis NOT DETECTED NOT DETECTED Final   Enterobacterales NOT DETECTED NOT DETECTED Final   Enterobacter cloacae complex NOT DETECTED NOT DETECTED Final   Escherichia coli NOT DETECTED NOT DETECTED Final   Klebsiella aerogenes NOT  DETECTED NOT DETECTED Final   Klebsiella oxytoca NOT DETECTED NOT DETECTED Final   Klebsiella pneumoniae NOT DETECTED NOT DETECTED Final   Proteus species NOT DETECTED NOT DETECTED Final   Salmonella species NOT DETECTED NOT DETECTED Final   Serratia marcescens NOT DETECTED NOT DETECTED Final   Haemophilus influenzae NOT DETECTED NOT DETECTED Final   Neisseria meningitidis NOT DETECTED NOT DETECTED Final   Pseudomonas aeruginosa NOT DETECTED NOT DETECTED Final   Stenotrophomonas maltophilia NOT DETECTED NOT DETECTED Final   Candida albicans NOT DETECTED NOT DETECTED Final   Candida auris NOT DETECTED NOT DETECTED Final   Candida glabrata DETECTED (A) NOT DETECTED Final    Comment: CRITICAL RESULT CALLED TO, READ BACK BY AND VERIFIED WITH: PHARMD J LEDFORD 08-22-2024 @ 0503 BY AB    Candida krusei NOT DETECTED NOT DETECTED Final   Candida parapsilosis NOT DETECTED NOT DETECTED Final   Candida tropicalis NOT DETECTED NOT DETECTED Final   Cryptococcus neoformans/gattii NOT DETECTED NOT DETECTED Final  Comment: Performed at [facility], [address] [place], [place] [address]  MRSA Next Gen by PCR, Nasal     Status: Abnormal   Collection Time: 08/12/24 11:25 AM   Specimen: Nasal Mucosa; Nasal Swab  Result Value Ref Range Status   MRSA by PCR Next Gen DETECTED (A) NOT DETECTED Final    Comment: RESULT CALLED TO, READ BACK BY AND VERIFIED WITH: RN CANDIE FELTY [phone] @ 1524 FH (NOTE) The GeneXpert MRSA Assay (FDA approved for NASAL specimens only), is one component of a comprehensive MRSA colonization surveillance program. It is not intended to diagnose MRSA infection nor to guide or monitor treatment for MRSA infections. Test performance is not FDA approved in patients less than 89 years old. Performed at [facility], [address] [place], [place] [address]   Culture, blood (Routine X 2) w Reflex to ID Panel     Status: Abnormal (Preliminary result)   Collection  Time: 08/12/24  3:29 PM   Specimen: BLOOD LEFT ARM  Result Value Ref Range Status   Specimen Description BLOOD LEFT ARM  Final   Special Requests   Final    BOTTLES DRAWN AEROBIC AND ANAEROBIC Blood Culture results may not be optimal due to an inadequate volume of blood received in culture bottles   Culture  Setup Time (A)  Final    YEAST AEROBIC BOTTLE ONLY CRITICAL RESULT CALLED TO, READ BACK BY AND VERIFIED WITH: MAYA HARP B [id number] FCP Performed at [facility], [address] [place], [place] [address]    Culture YEAST  Final   Report Status PENDING  Incomplete  Culture, blood (Routine X 2) w Reflex to ID Panel     Status: None (Preliminary result)   Collection Time: 08/12/24  3:29 PM   Specimen: BLOOD RIGHT HAND  Result Value Ref Range Status   Specimen Description BLOOD RIGHT HAND  Final   Special Requests   Final    AEROBIC BOTTLE ONLY Blood Culture results may not be optimal due to an inadequate volume of blood received in culture bottles   Culture   Final    NO GROWTH 3 DAYS Performed at [facility], [address] [place], [place] [address]    Report Status PENDING  Incomplete  Aerobic/Anaerobic Culture w Gram Stain (surgical/deep wound)     Status: None (Preliminary result)   Collection Time: 08/12/24  8:09 PM   Specimen: Abscess  Result Value Ref Range Status   Specimen Description ABSCESS  Final   Special Requests FLOOR OF MOUTH, UNASYN   Final   Gram Stain   Final    NO WBC SEEN ABUNDANT GRAM POSITIVE COCCI IN CLUSTERS Performed at [facility], [address], [place], [place] [address]    Culture   Final    MODERATE METHICILLIN RESISTANT STAPHYLOCOCCUS AUREUS NO ANAEROBES ISOLATED; CULTURE IN PROGRESS FOR 5 DAYS    Report Status PENDING  Incomplete   Organism ID, Bacteria METHICILLIN RESISTANT STAPHYLOCOCCUS AUREUS  Final      Susceptibility   Methicillin resistant staphylococcus aureus - MIC*    CIPROFLOXACIN  >=8 RESISTANT  Resistant     ERYTHROMYCIN >=8 RESISTANT Resistant     GENTAMICIN <=0.5 SENSITIVE Sensitive     OXACILLIN >=4 RESISTANT Resistant     TETRACYCLINE <=1 SENSITIVE Sensitive     VANCOMYCIN  1 SENSITIVE Sensitive     TRIMETH/SULFA <=10 SENSITIVE Sensitive     CLINDAMYCIN <=0.25  SENSITIVE Sensitive     RIFAMPIN <=0.5 SENSITIVE Sensitive     Inducible Clindamycin NEGATIVE Sensitive     LINEZOLID 2 SENSITIVE Sensitive     * MODERATE METHICILLIN RESISTANT STAPHYLOCOCCUS AUREUS    Lab Basic Metabolic Panel: Recent Labs  Lab 08/12/24 1012 08/12/24 1014 08/12/24 1014 08/12/24 1030 08/12/24 1031 08/12/24 1212 08/12/24 1520 08/13/24 0348 08/13/24 1027 08/13/24 1247 08/14/24 0514 08/14/24 0951 08/14/24 1611 09-08-24 0515  NA  --  141   < > 137   < > 142   < > 133*   < > 134* 135 134* 132* 133*  K  --  4.5   < > 3.9   < > 4.0   < > 3.7   < > 3.8 4.0 4.2 3.9 3.7  CL  --  102   < > 104  --  99  --  95*  --   --  96*  --   --  98  CO2  --  13*  --   --   --  21*  --  20*  --   --  26  --   --  24  GLUCOSE  --  56*   < > 52*  --  196*  --  98  --   --  134*  --   --  120*  BUN  --  74*   < > 80*  --  73*  --  75*  --   --  83*  --   --  95*  CREATININE  --  3.61*   < > 3.10*  --  3.25*  --  3.51*  --   --  4.45*  --   --  5.18*  CALCIUM   --  8.2*  --   --   --  6.9*  --  7.2*  --   --  7.1*  --   --  7.0*  MG  --  3.2*  --   --   --   --   --   --   --   --  2.3  --   --   --   PHOS 11.2*  --   --   --   --   --   --   --   --   --  6.4*  --   --   --    < > = values in this interval not displayed.   Liver Function Tests: Recent Labs  Lab 08/12/24 1014 08/14/24 0514  AST 419* 428*  ALT 234* 432*  ALKPHOS 116 140*  BILITOT 0.7 0.8  PROT 4.7* 5.1*  ALBUMIN  2.0* 1.5*   Recent Labs  Lab 08/12/24 1212  LIPASE 21  AMYLASE 28   No results for input(s): AMMONIA in the last 168 hours. CBC: Recent Labs  Lab 08/12/24 1012 08/12/24 1030 08/13/24 0348 08/13/24 1027  08/13/24 1247 08/14/24 0514 08/14/24 0951 08/14/24 1611 Sep 08, 2024 0515  WBC 47.7*  --  27.9*  --   --  47.0*  --   --  67.9*  NEUTROABS  --   --   --   --   --  41.8*  --   --   --   HGB 11.7*   < > 9.9*   < > 11.2* 9.8* 10.2* 9.2* 9.1*  HCT 38.1   < > 29.5*   < > 33.0* 29.2* 30.0* 27.0* 25.9*  MCV 107.0*  --  98.3  --   --  98.6  --   --  95.6  PLT 380  --  308  --   --  290  --   --  272   < > = values in this interval not displayed.   Cardiac Enzymes: No results for input(s): CKTOTAL, CKMB, CKMBINDEX, TROPONINI in the last 168 hours. Sepsis Labs: Recent Labs  Lab 08/12/24 1012 08/12/24 1026 08/13/24 0348 08/13/24 1021 08/13/24 1241 08/14/24 0514 08/14/24 1021 2024-08-18 0515  WBC 47.7*  --  27.9*  --   --  47.0*  --  67.9*  LATICACIDVEN  --    < > 3.3* 2.2* 2.6*  --  1.2  --    < > = values in this interval not displayed.    Procedures/Operations   08/12/24 intubation 08/12/24 CVC placement 08/12/24 arterial line placement  08/12/24 I&D neck abscess / Ludwig's angina     Ronnald FORBES Gave 18-Aug-2024, 4:21 PM

## 2024-08-22 DEATH — deceased
